# Patient Record
Sex: Male | Born: 1935 | Race: White | Hispanic: No | Smoking: Never smoker
Health system: Southern US, Community
[De-identification: ages and names within clinical notes are randomized; demographics above are authoritative.]

## PROBLEM LIST (undated history)

## (undated) DIAGNOSIS — R32 Unspecified urinary incontinence: Secondary | ICD-10-CM

## (undated) DIAGNOSIS — E559 Vitamin D deficiency, unspecified: Secondary | ICD-10-CM

## (undated) DIAGNOSIS — I739 Peripheral vascular disease, unspecified: Secondary | ICD-10-CM

## (undated) DIAGNOSIS — F0390 Unspecified dementia without behavioral disturbance: Secondary | ICD-10-CM

## (undated) DIAGNOSIS — R2681 Unsteadiness on feet: Secondary | ICD-10-CM

---

## 2017-06-13 ENCOUNTER — Emergency Department (HOSPITAL_COMMUNITY)
Admission: EM | Admit: 2017-06-13 | Discharge: 2017-06-13 | Disposition: A | Discharge status: Home / Self Care | Payer: Medicare Other | Attending: Emergency Medicine | Admitting: Emergency Medicine

## 2017-06-13 ENCOUNTER — Other Ambulatory Visit: Payer: Self-pay

## 2017-06-13 ENCOUNTER — Encounter (HOSPITAL_COMMUNITY): Payer: Self-pay

## 2017-06-13 ENCOUNTER — Emergency Department (HOSPITAL_COMMUNITY): Payer: Medicare Other

## 2017-06-13 DIAGNOSIS — W19XXXA Unspecified fall, initial encounter: Secondary | ICD-10-CM | POA: Insufficient documentation

## 2017-06-13 DIAGNOSIS — Y92129 Unspecified place in nursing home as the place of occurrence of the external cause: Secondary | ICD-10-CM | POA: Insufficient documentation

## 2017-06-13 DIAGNOSIS — Y999 Unspecified external cause status: Secondary | ICD-10-CM | POA: Insufficient documentation

## 2017-06-13 DIAGNOSIS — S3992XA Unspecified injury of lower back, initial encounter: Secondary | ICD-10-CM | POA: Diagnosis not present

## 2017-06-13 DIAGNOSIS — Y939 Activity, unspecified: Secondary | ICD-10-CM | POA: Insufficient documentation

## 2017-06-13 DIAGNOSIS — F0390 Unspecified dementia without behavioral disturbance: Secondary | ICD-10-CM | POA: Diagnosis not present

## 2017-06-13 HISTORY — DX: Unspecified urinary incontinence: R32

## 2017-06-13 HISTORY — DX: Unspecified dementia, unspecified severity, without behavioral disturbance, psychotic disturbance, mood disturbance, and anxiety: F03.90

## 2017-06-13 HISTORY — DX: Peripheral vascular disease, unspecified: I73.9

## 2017-06-13 HISTORY — DX: Vitamin D deficiency, unspecified: E55.9

## 2017-06-13 HISTORY — DX: Unsteadiness on feet: R26.81

## 2017-06-13 NOTE — ED Triage Notes (Signed)
Pt BIB [facility] for eval of lower back pain x 2 days. EMS reports fall yesterday, pt was not evaluated at that time. Pt currently anticoagulated on eliquis. Pt lives in a dementia unit and is a poor historian

## 2017-06-13 NOTE — Discharge Instructions (Addendum)
There were no signs of serious injury from the fall yesterday.  For pain, take Tylenol every 4 hours.  Try using heat on the sore area 3 or 4 times a day.

## 2017-06-13 NOTE — ED Provider Notes (Signed)
[facility] Provider Note   CSN: [id number] Arrival date & time: 06/13/17  1953     History   Chief Complaint Chief Complaint  Patient presents with  . Fall    HPI Paul Cox is a 84 y.o. male.  Patient presents for evaluation of low back pain, onset after a fall yesterday at his nursing care facility.  He is unable to specify what happened.  There is no report of head injury.  Fall was reported to be yesterday.  There are no other known modifying factors.  HPI  Past Medical History:  Diagnosis Date  . Dementia   . PVD (peripheral vascular disease) (HCC)   . Unsteadiness on feet   . Urinary incontinence   . Vitamin D deficiency     There are no active problems to display for this patient.   History reviewed. No pertinent surgical history.      Home Medications    Prior to Admission medications   Not on File    Family History History reviewed. No pertinent family history.  Social History Social History   Tobacco Use  . Smoking status: Never Smoker  . Smokeless tobacco: Never Used  Substance Use Topics  . Alcohol use: Not Currently  . Drug use: Not Currently     Allergies   Patient has no known allergies.   Review of Systems Review of Systems  Unable to perform ROS: Dementia     Physical Exam Updated Vital Signs BP (!) 122/106 (BP Location: Right Arm)   Pulse 65   Temp 98.8 F (37.1 C) (Oral)   Resp 20   Ht 5\' 7"  (1.702 m)   Wt 92.1 kg (203 lb)   SpO2 96%   BMI 31.79 kg/m   Physical Exam  Constitutional: He is oriented to person, place, and time. He appears well-developed and well-nourished.  HENT:  Head: Normocephalic and atraumatic.  Right Ear: External ear normal.  Left Ear: External ear normal.  Eyes: Pupils are equal, round, and reactive to light. Conjunctivae and EOM are normal.  Neck: Normal range of motion and phonation normal. Neck supple.  Cardiovascular: Normal rate  and regular rhythm.  Pulmonary/Chest: Effort normal and breath sounds normal. He exhibits no bony tenderness.  Abdominal: Soft. There is no tenderness.  Musculoskeletal:  Tender over the lumbar spine without deformity.  No tenderness over the thoracic spine or cervical spine.  Normal range of motion in arms and legs bilaterally.  Neurological: He is alert and oriented to person, place, and time. No cranial nerve deficit or sensory deficit. He exhibits normal muscle tone. Coordination normal.  Skin: Skin is warm, dry and intact.  Psychiatric: He has a normal mood and affect. His behavior is normal. Judgment and thought content normal.  Nursing note and vitals reviewed.    ED Treatments / Results  Labs (all labs ordered are listed, but only abnormal results are displayed) Labs Reviewed - No data to display  EKG None  Radiology Dg Lumbar Spine Complete  Result Date: 06/13/2017 CLINICAL DATA:  Back pain EXAM: LUMBAR SPINE - COMPLETE 4+ VIEW COMPARISON:  None. FINDINGS: Chronic appearing T12 compression fracture. Lower lumbar predominant disc space narrowing and facet hypertrophy. Mild delete the left convex curvature of the lower lumbar spine.Extensive vascular calcification. No anterior or posterior subluxation. IMPRESSION: 1. Chronic appearing compression fracture of T12. 2. Lower lumbar predominant degenerative disc disease and facet arthrosis. Electronically Signed   By: Deatra RobinsonKevin  Herman  M.D.   On: 06/13/2017 22:08   Dg Pelvis 1-2 Views  Result Date: 06/13/2017 CLINICAL DATA:  Pain EXAM: PELVIS - 1-2 VIEW COMPARISON:  None. FINDINGS: Staples in the right lower quadrant. The SI joints are non widened. Pubic symphysis and rami are intact. No acute displaced fracture or dislocation. IMPRESSION: No definite acute osseous abnormality. Electronically Signed   By: Jasmine Pang M.D.   On: 06/13/2017 22:01    Procedures Procedures (including critical care time)  Medications Ordered in  ED Medications - No data to display   Initial Impression / Assessment and Plan / ED Course  I have reviewed the triage vital signs and the nursing notes.  Pertinent labs & imaging results that were available during my care of the patient were reviewed by me and considered in my medical decision making (see chart for details).  Clinical Course as of Jun 14 2214  Tue Jun 13, 2017  2214 No acute fracture  DG Lumbar Spine Complete [EW]  2214 No acute fracture  DG Pelvis 1-2 Views [EW]    Clinical Course User Index [EW] Mancel Bale, MD     Patient Vitals for the past 24 hrs:  BP Temp Temp src Pulse Resp SpO2 Height Weight  06/13/17 1957 - - - - - - 5\' 7"  (1.702 m) 92.1 kg (203 lb)  06/13/17 1956 (!) 122/106 98.8 F (37.1 C) Oral 65 20 96 % - -  06/13/17 1953 - - - - - 95 % - -    10:16 PM Reevaluation with update and discussion. After initial assessment and treatment, an updated evaluation reveals no change in clinical status.  Discharge instructions written. Mancel Bale   Medical decision making -fall, subacute, without serious injury.  No fractures.  Doubt unstable metabolic condition.  Nursing Notes Reviewed/ Care Coordinated Applicable Imaging Reviewed Interpretation of Laboratory Data incorporated into ED treatment  The patient appears reasonably screened and/or stabilized for discharge and I doubt any other medical condition or other [facility] requiring further screening, evaluation, or treatment in the ED at this time prior to discharge.  Plan: Home Medications-continue usual medications and use Tylenol if needed for pain; Home Treatments-rest, heat; return here if the recommended treatment, does not improve the symptoms; Recommended follow up-PCP follow-up as needed.   Final Clinical Impressions(s) / ED Diagnoses   Final diagnoses:  Fall, initial encounter  Injury of back, initial encounter    ED Discharge Orders    None       Mancel Bale, MD 06/13/17  2218

## 2018-03-01 ENCOUNTER — Other Ambulatory Visit: Payer: Self-pay

## 2018-03-01 ENCOUNTER — Emergency Department (HOSPITAL_COMMUNITY): Payer: Medicare Other

## 2018-03-01 ENCOUNTER — Inpatient Hospital Stay (HOSPITAL_COMMUNITY)
Admission: EM | Admit: 2018-03-01 | Discharge: 2018-03-05 | DRG: 392 | Disposition: A | Discharge status: Skilled Nursing Facility | Payer: Medicare Other | Source: Skilled Nursing Facility | Attending: Student in an Organized Health Care Education/Training Program | Admitting: Student in an Organized Health Care Education/Training Program

## 2018-03-01 DIAGNOSIS — L304 Erythema intertrigo: Secondary | ICD-10-CM | POA: Diagnosis present

## 2018-03-01 DIAGNOSIS — R32 Unspecified urinary incontinence: Secondary | ICD-10-CM | POA: Diagnosis present

## 2018-03-01 DIAGNOSIS — N400 Enlarged prostate without lower urinary tract symptoms: Secondary | ICD-10-CM | POA: Diagnosis present

## 2018-03-01 DIAGNOSIS — Z79899 Other long term (current) drug therapy: Secondary | ICD-10-CM

## 2018-03-01 DIAGNOSIS — H9190 Unspecified hearing loss, unspecified ear: Secondary | ICD-10-CM | POA: Diagnosis present

## 2018-03-01 DIAGNOSIS — I739 Peripheral vascular disease, unspecified: Secondary | ICD-10-CM | POA: Diagnosis present

## 2018-03-01 DIAGNOSIS — E559 Vitamin D deficiency, unspecified: Secondary | ICD-10-CM | POA: Diagnosis present

## 2018-03-01 DIAGNOSIS — X58XXXA Exposure to other specified factors, initial encounter: Secondary | ICD-10-CM | POA: Diagnosis not present

## 2018-03-01 DIAGNOSIS — T3695XA Adverse effect of unspecified systemic antibiotic, initial encounter: Secondary | ICD-10-CM | POA: Diagnosis not present

## 2018-03-01 DIAGNOSIS — Z7901 Long term (current) use of anticoagulants: Secondary | ICD-10-CM

## 2018-03-01 DIAGNOSIS — R001 Bradycardia, unspecified: Secondary | ICD-10-CM | POA: Diagnosis present

## 2018-03-01 DIAGNOSIS — Z66 Do not resuscitate: Secondary | ICD-10-CM | POA: Diagnosis present

## 2018-03-01 DIAGNOSIS — Z86718 Personal history of other venous thrombosis and embolism: Secondary | ICD-10-CM

## 2018-03-01 DIAGNOSIS — R197 Diarrhea, unspecified: Secondary | ICD-10-CM

## 2018-03-01 DIAGNOSIS — K5732 Diverticulitis of large intestine without perforation or abscess without bleeding: Principal | ICD-10-CM | POA: Diagnosis present

## 2018-03-01 DIAGNOSIS — E860 Dehydration: Secondary | ICD-10-CM | POA: Diagnosis present

## 2018-03-01 DIAGNOSIS — K521 Toxic gastroenteritis and colitis: Secondary | ICD-10-CM | POA: Diagnosis not present

## 2018-03-01 DIAGNOSIS — K5792 Diverticulitis of intestine, part unspecified, without perforation or abscess without bleeding: Secondary | ICD-10-CM | POA: Diagnosis not present

## 2018-03-01 DIAGNOSIS — R1030 Lower abdominal pain, unspecified: Secondary | ICD-10-CM

## 2018-03-01 DIAGNOSIS — F0390 Unspecified dementia without behavioral disturbance: Secondary | ICD-10-CM | POA: Diagnosis present

## 2018-03-01 LAB — CBC
HCT: 42.1 % (ref 39.0–52.0)
Hemoglobin: 13.4 g/dL (ref 13.0–17.0)
MCH: 29.1 pg (ref 26.0–34.0)
MCHC: 31.8 g/dL (ref 30.0–36.0)
MCV: 91.3 fL (ref 80.0–100.0)
Platelets: 176 10*3/uL (ref 150–400)
RBC: 4.61 MIL/uL (ref 4.22–5.81)
RDW: 13.2 % (ref 11.5–15.5)
WBC: 7.2 10*3/uL (ref 4.0–10.5)
nRBC: 0 % (ref 0.0–0.2)

## 2018-03-01 LAB — COMPREHENSIVE METABOLIC PANEL
ALBUMIN: 3.3 g/dL — AB (ref 3.5–5.0)
ALT: 12 U/L (ref 0–44)
AST: 19 U/L (ref 15–41)
Alkaline Phosphatase: 56 U/L (ref 38–126)
Anion gap: 9 (ref 5–15)
BUN: 17 mg/dL (ref 8–23)
CHLORIDE: 110 mmol/L (ref 98–111)
CO2: 21 mmol/L — ABNORMAL LOW (ref 22–32)
Calcium: 8.7 mg/dL — ABNORMAL LOW (ref 8.9–10.3)
Creatinine, Ser: 1.21 mg/dL (ref 0.61–1.24)
GFR calc Af Amer: >60 mL/min (ref >60)
GFR, EST NON AFRICAN AMERICAN: 52 mL/min — AB (ref >60)
GLUCOSE: 98 mg/dL (ref 70–99)
Potassium: 4.2 mmol/L (ref 3.5–5.1)
Sodium: 140 mmol/L (ref 135–145)
Total Bilirubin: 0.6 mg/dL (ref 0.3–1.2)
Total Protein: 7 g/dL (ref 6.5–8.1)

## 2018-03-01 LAB — CK: Total CK: 59 U/L (ref 49–397)

## 2018-03-01 LAB — I-STAT CG4 LACTIC ACID, ED
LACTIC ACID, VENOUS: 1.1 mmol/L (ref 0.5–1.9)
Lactic Acid, Venous: 2.34 mmol/L (ref 0.5–1.9)

## 2018-03-01 LAB — MAGNESIUM: MAGNESIUM: 2.1 mg/dL (ref 1.7–2.4)

## 2018-03-01 LAB — LIPASE, BLOOD: LIPASE: 31 U/L (ref 11–51)

## 2018-03-01 MED ORDER — FOLIC ACID 1 MG PO TABS
1.0000 mg | ORAL_TABLET | Freq: Every day | ORAL | Status: DC
  Administered 2018-03-02 – 2018-03-05 (×4): 1 mg via ORAL
  Filled 2018-03-01 (×4): qty 1

## 2018-03-01 MED ORDER — SODIUM CHLORIDE 0.9 % IV SOLN
INTRAVENOUS | Status: DC
  Administered 2018-03-02 – 2018-03-03 (×4): via INTRAVENOUS

## 2018-03-01 MED ORDER — SODIUM CHLORIDE 0.9 % IV BOLUS
500.0000 mL | Freq: Once | INTRAVENOUS | Status: AC
  Administered 2018-03-01: 500 mL via INTRAVENOUS

## 2018-03-01 MED ORDER — APIXABAN 5 MG PO TABS
5.0000 mg | ORAL_TABLET | Freq: Two times a day (BID) | ORAL | Status: DC
  Administered 2018-03-02 – 2018-03-05 (×8): 5 mg via ORAL
  Filled 2018-03-01 (×8): qty 1

## 2018-03-01 MED ORDER — QUETIAPINE FUMARATE 50 MG PO TABS
50.0000 mg | ORAL_TABLET | Freq: Every day | ORAL | Status: DC
  Administered 2018-03-02 – 2018-03-04 (×4): 50 mg via ORAL
  Filled 2018-03-01 (×4): qty 1

## 2018-03-01 MED ORDER — IOHEXOL 300 MG/ML  SOLN
100.0000 mL | Freq: Once | INTRAMUSCULAR | Status: AC | PRN
  Administered 2018-03-01: 100 mL via INTRAVENOUS

## 2018-03-01 MED ORDER — SODIUM CHLORIDE 0.9% FLUSH: 3.0000 mL | INTRAVENOUS | Status: DC | PRN

## 2018-03-01 MED ORDER — SODIUM CHLORIDE 0.9 % IV SOLN
2.0000 g | Freq: Once | INTRAVENOUS | Status: AC
  Administered 2018-03-01: 2 g via INTRAVENOUS
  Filled 2018-03-01: qty 20

## 2018-03-01 MED ORDER — NYSTATIN 100000 UNIT/GM EX POWD
Freq: Three times a day (TID) | CUTANEOUS | Status: DC
  Administered 2018-03-02 – 2018-03-05 (×8): via TOPICAL
  Filled 2018-03-01: qty 15

## 2018-03-01 MED ORDER — NYSTATIN 100000 UNIT/GM EX POWD
Freq: Once | CUTANEOUS | Status: AC
  Administered 2018-03-02: 01:00:00 via TOPICAL
  Filled 2018-03-01: qty 15

## 2018-03-01 MED ORDER — SODIUM CHLORIDE 0.9 % IV SOLN: 250.0000 mL | INTRAVENOUS | Status: DC | PRN

## 2018-03-01 MED ORDER — ADULT MULTIVITAMIN W/MINERALS CH
1.0000 | ORAL_TABLET | Freq: Every day | ORAL | Status: DC
  Administered 2018-03-02 – 2018-03-04 (×3): 1 via ORAL
  Filled 2018-03-01 (×4): qty 1

## 2018-03-01 MED ORDER — METRONIDAZOLE IN NACL 5-0.79 MG/ML-% IV SOLN
500.0000 mg | Freq: Three times a day (TID) | INTRAVENOUS | Status: DC
  Administered 2018-03-02: 500 mg via INTRAVENOUS
  Filled 2018-03-01 (×2): qty 100

## 2018-03-01 MED ORDER — SODIUM CHLORIDE 0.9% FLUSH: 3.0000 mL | Freq: Two times a day (BID) | INTRAVENOUS | Status: DC

## 2018-03-01 MED ORDER — VITAMIN B-12 1000 MCG PO TABS
1000.0000 ug | ORAL_TABLET | Freq: Every day | ORAL | Status: DC
  Administered 2018-03-02 – 2018-03-05 (×4): 1000 ug via ORAL
  Filled 2018-03-01 (×4): qty 1

## 2018-03-01 MED ORDER — ACETAMINOPHEN 325 MG PO TABS
650.0000 mg | ORAL_TABLET | Freq: Four times a day (QID) | ORAL | Status: DC | PRN
  Filled 2018-03-01: qty 2

## 2018-03-01 MED ORDER — SODIUM CHLORIDE 0.9 % IV SOLN: 2.0000 g | INTRAVENOUS | Status: DC

## 2018-03-01 MED ORDER — QUETIAPINE FUMARATE 25 MG PO TABS
25.0000 mg | ORAL_TABLET | ORAL | Status: DC
  Administered 2018-03-02 – 2018-03-05 (×7): 25 mg via ORAL
  Filled 2018-03-01 (×7): qty 1

## 2018-03-01 MED ORDER — SENNOSIDES-DOCUSATE SODIUM 8.6-50 MG PO TABS: 1.0000 | ORAL_TABLET | Freq: Every evening | ORAL | Status: DC | PRN

## 2018-03-01 MED ORDER — METRONIDAZOLE IN NACL 5-0.79 MG/ML-% IV SOLN
500.0000 mg | Freq: Once | INTRAVENOUS | Status: AC
  Administered 2018-03-01: 500 mg via INTRAVENOUS
  Filled 2018-03-01: qty 100

## 2018-03-01 MED ORDER — ACETAMINOPHEN 650 MG RE SUPP: 650.0000 mg | Freq: Four times a day (QID) | RECTAL | Status: DC | PRN

## 2018-03-01 MED ORDER — DONEPEZIL HCL 10 MG PO TABS
10.0000 mg | ORAL_TABLET | Freq: Every day | ORAL | Status: DC
  Administered 2018-03-02 – 2018-03-04 (×4): 10 mg via ORAL
  Filled 2018-03-01 (×4): qty 1

## 2018-03-01 MED ORDER — VITAMIN D 25 MCG (1000 UNIT) PO TABS
1000.0000 [IU] | ORAL_TABLET | Freq: Every day | ORAL | Status: DC
  Administered 2018-03-02 – 2018-03-05 (×4): 1000 [IU] via ORAL
  Filled 2018-03-01 (×4): qty 1

## 2018-03-01 MED ORDER — TRAZODONE HCL 50 MG PO TABS
25.0000 mg | ORAL_TABLET | Freq: Two times a day (BID) | ORAL | Status: DC | PRN
  Administered 2018-03-02: 25 mg via ORAL
  Filled 2018-03-01: qty 1

## 2018-03-01 NOTE — ED Notes (Signed)
This RN unable to in and out cath pt due to foreskin issue. Unable to get foreskin to retract and pt complains of pain when penis or scrotum/groin touched when cleaning pt up. Pt had soiled brief on and feces noted to pt's hands.

## 2018-03-01 NOTE — ED Triage Notes (Signed)
[facility] by EMS [facility] [place] [facility] for decreased appetite and ABD pain 2 days ago. Pt has dementia and is a&ox1. Per pt, he doesn't have any ABD pain at this time.

## 2018-03-01 NOTE — H&P (Addendum)
Date: 03/02/2018               Patient Name:  Paul Cox MRN: [id number]  DOB: 16-Feb-1925 Age / Sex: 84 y.o., male   PCP: Patient, No Pcp Per         Medical Service: Internal Medicine Teaching Service         Attending Physician: Dr. Oswaldo Done, Marquita Palms, *    First Contact: Dr. Gwyneth Revels Pager: [phone]  Second Contact: Dr. Frances Furbish Pager: [phone]       After Hours (After 5p/  First Contact Pager: [phone]  weekends / holidays): Second Contact Pager: [phone]   Chief Complaint: Abdominal pain  History of Present Illness: 84 year old male with past medical history of dementia, PVD, vitamin D deficiency, urinary incontinency, and DVT presented with abdominal pain. When saw the patient for admission, he confirms that he had some pain on his abdomen and felt tired today. How ever, due to dementia we were unable to obtain detailed history from the patient. As nursing facility staff reported to EMS, Paul Cox complained of suprapubic pain for past 2 days and had decreased oral intake since 2 weeks ago. Per EMS note, no nause or vomiting, diarrhea and no fever or chills reported by nursing staff in facility. (-->We contacted nursing facility and son after seeing the patient and they confirmed the history)  ED course: Patient has been afebrile and stable, with no leukocytosis (or leukopenia.) on arrival. CT abdomen, was concerning for early acute sigmoid diverticulitis. Patient started with IV ceftriaxone and Flagyl and was given IV fluid at ED. Internal medicine teaching service was then consulted for admission.   Meds:  Current Meds  Medication Sig  . acetaminophen (TYLENOL) 500 MG tablet Take 500 mg by mouth every 8 (eight) hours as needed (for pain).  Marland Kitchen apixaban (ELIQUIS) 5 MG TABS tablet Take 5 mg by mouth 2 (two) times daily.  . Cholecalciferol (VITAMIN D3) 25 MCG (1000 UT) CAPS Take 1,000 Units by mouth daily.  Marland Kitchen donepezil (ARICEPT) 10 MG tablet Take 10 mg by  mouth at bedtime.  . folic acid (FOLVITE) 1 MG tablet Take 1 mg by mouth daily.  Marland Kitchen loperamide (IMODIUM A-D) 2 MG tablet Take 2 mg by mouth every 6 (six) hours as needed for diarrhea or loose stools.  . Multiple Vitamins-Minerals (ONE-A-DAY PROACTIVE 65+) TABS Take 1 tablet by mouth daily.  . QUEtiapine (SEROQUEL) 25 MG tablet Take 25 mg by mouth 2 (two) times daily.  . QUEtiapine (SEROQUEL) 50 MG tablet Take 50 mg by mouth at bedtime.  . traZODone (DESYREL) 25 mg TABS tablet Take 12.5 mg by mouth every 12 (twelve) hours as needed (for agitation).  . vitamin B-12 (CYANOCOBALAMIN) 1000 MCG tablet Take 1,000 mcg by mouth daily.  Marland Kitchen zolpidem (AMBIEN) 5 MG tablet Take 2.5 mg by mouth at bedtime.     Allergies: Allergies as of 03/01/2018  . (No Known Allergies)   Past Medical History:  Diagnosis Date  . Dementia   . PVD (peripheral vascular disease) (HCC)   . Unsteadiness on feet   . Urinary incontinence   . Vitamin D deficiency     Family History:  No family Hx on chart. No Hx available due to dementia  Social History:   Never smoked No alcohol use No drug use  Review of Systems: A complete ROS was negative except as per HPI.  Physical Exam: Blood pressure 138/85, pulse 66, temperature 98.6 F (37 C),  temperature source Rectal, resp. rate (!) 25, SpO2 95 %.  Physical Exam Constitutional:      General: He is not in acute distress. Mucosa is moist HENT:     Head: Normocephalic and atraumatic.  Eyes:     Extraocular Movements: Extraocular movements intact.  Cardiovascular:     Rate and Rhythm: Bradycardia present. Iregular rhythm vs extra beat.     Heart sounds: Normal heart sounds.   Pulmonary:     Effort: Pulmonary effort is normal.     Breath sounds: course crackle present mostly at right base (patient lying on right side).   Abdominal:     General: Bowel sounds are normal.     Palpations: Abdomen is soft    Tenderness: There is no abdominal tenderness.    Genitourinary:   Neurological:     Mental Status: He is alert. Oriented to person but not to place and time.   Labs reviewed:  CBC nl with WBC at 7.2 Lactic acid initialy elevated at 2.34-->improved :1.10 CMP unremarkable except decreased GFR, Cr:1.2 (unclear baseline) CK and Mg normal  EKG: personally reviewed my interpretation is bradycardia, poor R progression, possible left atrial enlargement (Tele: P wave present. PAC, pauses with longest noted at 2.5 seconds.)  CXR: personally reviewed my interpretation is bibasilar atelectasis, mild interstitial edema  Assessment & Plan by Problem: Active Problems:   Diverticulitis   Intertrigo  84 year old male with PMHx of dementia, PVD, vitamin D deficiency, urinary incontinency, presented from nursing facility due to 2 days history of suprapubic abdominal pain and 2 weeks of poor PO intake.  Diverticulitis:  Abdominal pain and poor PO intake, with no fever, no nausea or vomiting, no diarrhea. CT scan showed inflammatory changes about diverticula in the sigmoid colon. No abscess, free fluid, or free air is associated. CBC normal. With elevated lactic acid on arrival.  Benign abdominal exam, lactic acid improved after hydration. Mamie Laurel(Likeley elavated due to poor PO intake.) We will continue IV ceftriaxone and Flagyl for diverticulitis. (Uncomplicated diverticulitis. How ever, considering age>70 and poor PO intake.., inpatient treatment for IV Ab and IV fluid indicated. Possible AKI? Cr:1.2, baseline unclear. Can be 2/2 dehydration and poor PO intake. Will give IV fluid and checking BMP in the morning. UTI?  Unable to insert catheter and no urine sample collected.   -N.p.o., may be able to switch to clear diet tomorrow -NS 17325ml/h -Continue IV ceftraixon 2 g QD -Continue IV Flagyl 500 mg q 8 h -Follow up obtaing urine sample for U/A and U/C -BMP in AM -CBC in AM -Tylenol 650 mg p.o. every 6 hours PRN for pain/fever -Cardiac  monitoring -Senna-decusate 1 tab at bed time PRN for constipation  Intertrigo: Some erythematous area on perineal area likely fungal.  -Continue applying Nistatin topical powder every 8 hours groin  Bradycardia at 50s and with pause on Tele: (Longest pause noted at 2.5 sec). No evidence of Afib or AV block on tele. Asymptomatic. Will check TSH and monitor patient. -TSH -Cardiac monitoring   Irregular prominent prostate gland reported on CT scan: Unclear if it is a new problem. Has Hx of urinary incontinency. Bladder scan with 100 cc residual, no retension. U/A catheterization was unsuccessful and no sample available at this time. Recommend ultrasound and follow-up accordingly.  Chronic problems:  Hx of DVT (Per record from facility): On Eliquis 5 mg twice daily at home since 05/25/2017 (unclear end date).   -Continue Eliquis 5 mg twice daily  Dementia: Oriented x1-2 on  baseline.  Not oriented to place and time on admission, cooperete well with exam.  -Continue home dose of donepezil (10 mg at bedtime) -Continue home dose of Seroquel 25 mg  At AM and 75 mg at PM -Trazodone 25 mg twice daily PRN for agitation  Vit D def: -Continue home meds: vitamin D3 1000 units p.o. daily  -Will continue other home meds: Vitamin B12 1000 MCG p.o. daily and folic acid 1 mg daily  Diet: NPO IV fluid: NS 125 ml/h VTE ppx: Eliquis  Code status: DNR (Dr. Alinda MoneyMelvin talked to the son, Dianna Limbougene A. Pankowski who is patient's HCPOA, and he confirmed DNR status.  Dispo: Admit patient to Observation with expected length of stay less than 2 midnights.  SignedChevis Pretty: Nikhita Mentzel, MD 03/02/2018, 12:09 AM  Pager: [phone]

## 2018-03-01 NOTE — ED Notes (Signed)
Pt. Has no urine present in urine drainage bag

## 2018-03-01 NOTE — ED Provider Notes (Signed)
[facility] EM[facility]GENCY [facility] Provider Note   CSN: [id number] Arrival date & time: 03/01/18  1539     History   Chief Complaint Chief Complaint  Patient presents with  . Abdominal Pain    HPI Barkley Brunsugene J Cox is a 84 y.o. male.  The history is provided by medical records and the patient. No language interpreter was used.  Abdominal Pain   This is a new problem. The current episode started 2 days ago. The problem occurs rarely. The problem has been resolved. The pain is associated with an unknown factor. The pain is located in the generalized abdominal region and perineum. Pain scale: unkn own. Associated symptoms include anorexia. Pertinent negatives include fever, diarrhea, melena, nausea, vomiting, constipation, dysuria and frequency. Nothing aggravates the symptoms.    Past Medical History:  Diagnosis Date  . Dementia   . PVD (peripheral vascular disease) (HCC)   . Unsteadiness on feet   . Urinary incontinence   . Vitamin D deficiency     There are no active problems to display for this patient.   No past surgical history on file.      Home Medications    Prior to Admission medications   Not on File    Family History No family history on file.  Social History Social History   Tobacco Use  . Smoking status: Never Smoker  . Smokeless tobacco: Never Used  Substance Use Topics  . Alcohol use: Not Currently  . Drug use: Not Currently     Allergies   Patient has no known allergies.   Review of Systems Review of Systems  Unable to perform ROS: Dementia  Constitutional: Positive for appetite change and fatigue. Negative for chills, diaphoresis and fever.  HENT: Negative for congestion.   Respiratory: Negative for cough, chest tightness, shortness of breath and wheezing.   Cardiovascular: Negative for chest pain.  Gastrointestinal: Positive for abdominal pain (resolved) and anorexia. Negative for constipation, diarrhea,  melena, nausea and vomiting.  Genitourinary: Positive for genital sores and penile pain. Negative for dysuria, flank pain and frequency.  Musculoskeletal: Negative for back pain.  Neurological: Negative for seizures and numbness.  Psychiatric/Behavioral: Negative for agitation.     Physical Exam Updated Vital Signs BP 128/71 (BP Location: Right Arm)   Pulse 63   Temp 98.2 F (36.8 C) (Oral)   Resp 18   SpO2 98%   Physical Exam Vitals signs and nursing note reviewed. Exam conducted with a chaperone present.  Constitutional:      General: He is not in acute distress.    Appearance: He is not ill-appearing, toxic-appearing or diaphoretic.  HENT:     Head: Normocephalic and atraumatic.     Mouth/Throat:     Pharynx: No oropharyngeal exudate.  Eyes:     Extraocular Movements: Extraocular movements intact.  Cardiovascular:     Rate and Rhythm: Bradycardia present.     Heart sounds: No murmur.  Pulmonary:     Effort: Pulmonary effort is normal. No respiratory distress.     Breath sounds: No stridor. No wheezing or rhonchi.  Abdominal:     General: Abdomen is flat. Bowel sounds are normal. There is no distension.     Tenderness: There is abdominal tenderness in the suprapubic area. There is no right CVA tenderness, left CVA tenderness or guarding.  Genitourinary:    Pubic Area: Rash present.     Scrotum/Testes:        Right: Tenderness not present.  Left: Tenderness not present.       Comments: Chaperone was utilized for exam.  Patient has tenderness, erythema, and warmth in the base of scrotum and in the perineum and lower groin. Skin:    General: Skin is warm.     Capillary Refill: Capillary refill takes less than 2 seconds.     Findings: Erythema and rash present.     Comments: Patient has erythematous rash on his groin and on his scrotum.  Actual testicles are not significantly tender.  Abdomen otherwise nontender.  Perineum tender.  Back nontender.  Neurological:       Mental Status: He is disoriented.      ED Treatments / Results  Labs (all labs ordered are listed, but only abnormal results are displayed) Labs Reviewed  COMPREHENSIVE METABOLIC PANEL - Abnormal; Notable for the following components:      Result Value   CO2 21 (*)    Calcium 8.7 (*)    Albumin 3.3 (*)    GFR calc non Af Amer 52 (*)    All other components within normal limits  I-STAT CG4 LACTIC ACID, ED - Abnormal; Notable for the following components:   Lactic Acid, Venous 2.34 (*)    All other components within normal limits  URINE CULTURE  LIPASE, BLOOD  CBC  MAGNESIUM  CK  URINALYSIS, ROUTINE W REFLEX MICROSCOPIC  BASIC METABOLIC PANEL  I-STAT CG4 LACTIC ACID, ED    EKG EKG Interpretation  Date/Time:  Thursday March 01 2018 16:33:49 EST Ventricular Rate:  56 PR Interval:    QRS Duration: 82 QT Interval:  459 QTC Calculation: 443 R Axis:   -10 Text Interpretation:  Sinus rhythm Probable anterolateral infarct, age indeterm No prior ECG for comparison.  No STEMI Confirmed by Theda Belfastegeler, Chris ([id number]) on 03/01/2018 4:53:47 PM   Radiology Dg Chest 2 View  Result Date: 03/01/2018 CLINICAL DATA:  Fatigue and decreased appetite. Abdominal pain and groin erythema. EXAM: CHEST - 2 VIEW COMPARISON:  None FINDINGS: Low lung volumes. Borderline cardiomegaly with moderate aortic atherosclerosis. Mild interstitial edema is noted with probable trace posterior pleural effusions. Atelectasis is seen at each lung base. IMPRESSION: 1. Borderline cardiomegaly with moderate aortic atherosclerosis. 2. Mild interstitial edema with trace posterior pleural effusions suspected. 3. Bibasilar atelectasis. Electronically Signed   By: Tollie Ethavid  Kwon M.D.   On: 03/01/2018 18:10   Ct Abdomen Pelvis W Contrast  Result Date: 03/01/2018 CLINICAL DATA:  Decreased appetite.  Abdominal pain for 2 days. EXAM: CT ABDOMEN AND PELVIS WITH CONTRAST TECHNIQUE: Multidetector CT imaging of the abdomen and  pelvis was performed using the standard protocol following bolus administration of intravenous contrast. CONTRAST:  100mL OMNIPAQUE IOHEXOL 300 MG/ML  SOLN COMPARISON:  None. FINDINGS: Lower chest: Dependent atelectasis is present bilaterally. The heart is enlarged. Atherosclerotic changes are noted in the descending thoracic aorta and coronary arteries. No significant pleural or pericardial effusion is present. Hepatobiliary: There is diffuse fatty infiltration liver. No discrete lesions are present. The gallbladder is mildly distended. No stone or inflammatory changes are present. The common bile duct is within normal limits for age. Pancreas: Pancreas is atrophic. No discrete lesions are present. Spleen: Normal in size without focal abnormality. Adrenals/Urinary Tract: The adrenal glands and kidneys are normal bilaterally. There is bilateral thinning of the renal parenchyma. No mass lesion or stone is present. There is no hydronephrosis. Ureters and urinary bladder are normal. Stomach/Bowel: A small hiatal hernia is present. The stomach and duodenum are otherwise within  normal limits. Small bowel is unremarkable. Terminal ileum is within normal limits. The appendix is visualized and normal. The ascending and transverse colon are within normal limits. Diverticular changes are present throughout the descending colon. Diverticular changes extend through the sigmoid colon. There is mild phlegm a tori changes proximally suggesting early colonic diverticulitis. No abscess is present. There is no free air. Rectum is unremarkable Vascular/Lymphatic: Extensive atherosclerotic changes are present. There is no aneurysm. No significant retroperitoneal adenopathy is present. Reproductive: Prostate gland is heterogeneous with some nodularity into the bladder base. A right seminal vesicle cyst measures 2.7 cm. Alternatively, this could represent a bladder diverticulum. It appears to be associated with the right seminal vesicle.  Other: Fat herniates into the right inguinal canal. There is no associated bowel. Previous right lower quadrant hernia repair is noted. No residual or recurrent hernia is evident. No free fluid is present. Musculoskeletal: Rightward curvature of the thoracolumbar spine is centered at L2-3. Asymmetric endplate degenerative changes are noted on the left. Asymmetric right-sided changes are present at L4-5 and L5-S1. Vertebral body heights are otherwise maintained remote fractures are present at T11, T12, and L1. No acute fractures are present. IMPRESSION: 1. Inflammatory changes about diverticula in the sigmoid colon concerning for early acute sigmoid diverticulitis. No abscess, free fluid, or free air is associated. 2. Irregular prominent prostate gland. Please correlate with physical exam and laboratory testing as appropriate. No definite tumor is present. 3.  Aortic Atherosclerosis (ICD10-I70.0). 4. Hepatic steatosis. 5. Cardiomegaly without failure. 6. Coronary artery disease. 7. Small hiatal hernia. 8. Scoliosis and degenerative changes in the lower lumbar spine. Electronically Signed   By: Marin Roberts M.D.   On: 03/01/2018 17:51    Procedures Procedures (including critical care time)  CRITICAL CARE Performed by: Canary Brim Tegeler Total critical care time: 35 minutes Critical care time was exclusive of separately billable procedures and treating other patients. Critical care was necessary to treat or prevent imminent or life-threatening deterioration. Critical care was time spent personally by me on the following activities: development of treatment plan with patient and/or surrogate as well as nursing, discussions with consultants, evaluation of patient's response to treatment, examination of patient, obtaining history from patient or surrogate, ordering and performing treatments and interventions, ordering and review of laboratory studies, ordering and review of radiographic studies,  pulse oximetry and re-evaluation of patient's condition.   Medications Ordered in ED Medications  nystatin (MYCOSTATIN/NYSTOP) topical powder (has no administration in time range)  cefTRIAXone (ROCEPHIN) 2 Paul in sodium chloride 0.9 % 100 mL IVPB (has no administration in time range)  metroNIDAZOLE (FLAGYL) IVPB 500 mg (has no administration in time range)  donepezil (ARICEPT) tablet 10 mg (has no administration in time range)  ONE-A-DAY PROACTIVE 65+ TABS 1 tablet (has no administration in time range)  vitamin B-12 (CYANOCOBALAMIN) tablet 1,000 mcg (has no administration in time range)  Vitamin D3 CAPS 1,000 Units (has no administration in time range)  apixaban (ELIQUIS) tablet 5 mg (has no administration in time range)  QUEtiapine (SEROQUEL) tablet 25 mg (has no administration in time range)  traZODone (DESYREL) tablet 25 mg (has no administration in time range)  QUEtiapine (SEROQUEL) tablet 50 mg (has no administration in time range)  folic acid (FOLVITE) tablet 1 mg (has no administration in time range)  nystatin (MYCOSTATIN/NYSTOP) topical powder (has no administration in time range)  sodium chloride flush (NS) 0.9 % injection 3 mL (has no administration in time range)  sodium chloride flush (NS) 0.9 %  injection 3 mL (has no administration in time range)  sodium chloride flush (NS) 0.9 % injection 3 mL (has no administration in time range)  0.9 %  sodium chloride infusion (has no administration in time range)  acetaminophen (TYLENOL) tablet 650 mg (has no administration in time range)    Or  acetaminophen (TYLENOL) suppository 650 mg (has no administration in time range)  senna-docusate (Senokot-S) tablet 1 tablet (has no administration in time range)  iohexol (OMNIPAQUE) 300 MG/ML solution 100 mL (100 mLs Intravenous Contrast Given 03/01/18 1728)  sodium chloride 0.9 % bolus 500 mL (0 mLs Intravenous Stopped 03/01/18 2026)  cefTRIAXone (ROCEPHIN) 2 Paul in sodium chloride 0.9 % 100 mL  IVPB (0 Paul Intravenous Stopped 03/01/18 2000)    And  metroNIDAZOLE (FLAGYL) IVPB 500 mg (0 mg Intravenous Stopped 03/01/18 2140)     Initial Impression / Assessment and Plan / ED Course  I have reviewed the triage vital signs and the nursing notes.  Pertinent labs & imaging results that were available during my care of the patient were reviewed by me and considered in my medical decision making (see chart for details).     Paul Cox is a 84 y.o. male with a past medical history significant for PVD, dementia, and chronic urinary incontinence who presents from his nursing facility for intermittent abdominal discomfort, fatigue, and decreased oral intake.  Patient is only oriented to person and is unable to provide significant history.  EMS reports that for the last 2 days patient has had intermittent abdominal pain at this facility.  He is also not been eating or drinking.  He is reported feeling tired.  They report no evidence of fevers, chills, nausea, vomiting, chest pain, or shortness of breath.  Patient was unable to describe any discomfort he was having.  Level 5 caveat for dementia.  On exam, patient has erythema and tenderness on his scrotum and groin and perineal area.  Patient has some tenderness in the suprapubic area of the abdomen.  Patient is uncircumcised.  Patient had difficulty with urination.  Nursing reports that patient had stool all over his body on arrival.  Patient's abdomen was nontender on my exam otherwise.  Lungs were clear and chest was nontender.  Patient is oriented to person.  Patient moving all extremities.  Patient otherwise resting comfortably.  Clinically I am concerned that the patient may have a skin infection in his groin and scrotum.  Given the amount of tenderness present, will obtain CT imaging after lab work to rule out abscess or deep abdominal infection such as Fournier's gangrene.  Will obtain catheterized urine to get a clean sample.  Patient  will also have other laboratory testing given his decreased oral intake.  Anticipate reassessment after work-up.  10:03 PM Nursing reported they tried in and out catheterization were unsuccessful due to patient's foreskin and the irritation of the skin likely due to fungal infection.  Patient had a condom catheter placed.  After several hours, bladder scan only revealed 120 cc of fluid, patient still unable to provide urine sample.  Lactic acid improved while in the emergency department after some fluids.  CT scan revealed evidence of diverticulitis.  Patient still had some mild abdominal tenderness in his lower abdomen.  Patient was intermittently bradycardic into the 50s and 40s.  Patient is still not been able to urinate for [place] to rule out urinary tract infection as well.  Given the patient's age, inability to urinate, initially elevated lactic  acid, the diverticulitis with abdominal pain, and bradycardia at times, patient was felt appropriate for admission and IV antibiotics and rehydration.  Patient will be admitted for further management.   Final Clinical Impressions(s) / ED Diagnoses   Final diagnoses:  Lower abdominal pain  Diverticulitis     Clinical Impression: 1. Lower abdominal pain   2. Diverticulitis     Disposition: Admit  This note was prepared with assistance of Dragon voice recognition software. Occasional wrong-word or sound-a-like substitutions may have occurred due to the inherent limitations of voice recognition software.     Tegeler, Canary Brim, MD 03/01/18 [phone]

## 2018-03-02 DIAGNOSIS — E559 Vitamin D deficiency, unspecified: Secondary | ICD-10-CM

## 2018-03-02 DIAGNOSIS — K5792 Diverticulitis of intestine, part unspecified, without perforation or abscess without bleeding: Secondary | ICD-10-CM | POA: Diagnosis present

## 2018-03-02 DIAGNOSIS — N39498 Other specified urinary incontinence: Secondary | ICD-10-CM

## 2018-03-02 DIAGNOSIS — R32 Unspecified urinary incontinence: Secondary | ICD-10-CM | POA: Diagnosis present

## 2018-03-02 DIAGNOSIS — Z7901 Long term (current) use of anticoagulants: Secondary | ICD-10-CM

## 2018-03-02 DIAGNOSIS — F0390 Unspecified dementia without behavioral disturbance: Secondary | ICD-10-CM | POA: Diagnosis present

## 2018-03-02 DIAGNOSIS — R001 Bradycardia, unspecified: Secondary | ICD-10-CM | POA: Diagnosis present

## 2018-03-02 DIAGNOSIS — Z66 Do not resuscitate: Secondary | ICD-10-CM | POA: Diagnosis present

## 2018-03-02 DIAGNOSIS — E860 Dehydration: Secondary | ICD-10-CM | POA: Diagnosis present

## 2018-03-02 DIAGNOSIS — K5732 Diverticulitis of large intestine without perforation or abscess without bleeding: Principal | ICD-10-CM

## 2018-03-02 DIAGNOSIS — L304 Erythema intertrigo: Secondary | ICD-10-CM | POA: Diagnosis present

## 2018-03-02 DIAGNOSIS — Z79899 Other long term (current) drug therapy: Secondary | ICD-10-CM | POA: Diagnosis not present

## 2018-03-02 DIAGNOSIS — Z86718 Personal history of other venous thrombosis and embolism: Secondary | ICD-10-CM | POA: Diagnosis not present

## 2018-03-02 DIAGNOSIS — N401 Enlarged prostate with lower urinary tract symptoms: Secondary | ICD-10-CM

## 2018-03-02 DIAGNOSIS — I739 Peripheral vascular disease, unspecified: Secondary | ICD-10-CM

## 2018-03-02 DIAGNOSIS — X58XXXA Exposure to other specified factors, initial encounter: Secondary | ICD-10-CM | POA: Diagnosis not present

## 2018-03-02 DIAGNOSIS — K5790 Diverticulosis of intestine, part unspecified, without perforation or abscess without bleeding: Secondary | ICD-10-CM | POA: Diagnosis not present

## 2018-03-02 DIAGNOSIS — H9190 Unspecified hearing loss, unspecified ear: Secondary | ICD-10-CM | POA: Diagnosis present

## 2018-03-02 DIAGNOSIS — T3695XA Adverse effect of unspecified systemic antibiotic, initial encounter: Secondary | ICD-10-CM | POA: Diagnosis not present

## 2018-03-02 DIAGNOSIS — K521 Toxic gastroenteritis and colitis: Secondary | ICD-10-CM | POA: Diagnosis not present

## 2018-03-02 DIAGNOSIS — N400 Enlarged prostate without lower urinary tract symptoms: Secondary | ICD-10-CM | POA: Diagnosis present

## 2018-03-02 LAB — BASIC METABOLIC PANEL
ANION GAP: 9 (ref 5–15)
BUN: 13 mg/dL (ref 8–23)
CO2: 23 mmol/L (ref 22–32)
Calcium: 7.9 mg/dL — ABNORMAL LOW (ref 8.9–10.3)
Chloride: 110 mmol/L (ref 98–111)
Creatinine, Ser: 1.14 mg/dL (ref 0.61–1.24)
GFR calc Af Amer: >60 mL/min (ref >60)
GFR calc non Af Amer: 56 mL/min — ABNORMAL LOW (ref >60)
Glucose, Bld: 105 mg/dL — ABNORMAL HIGH (ref 70–99)
Potassium: 3.5 mmol/L (ref 3.5–5.1)
Sodium: 142 mmol/L (ref 135–145)

## 2018-03-02 LAB — CBC
HEMATOCRIT: 35 % — AB (ref 39.0–52.0)
Hemoglobin: 11.6 g/dL — ABNORMAL LOW (ref 13.0–17.0)
MCH: 30.1 pg (ref 26.0–34.0)
MCHC: 33.1 g/dL (ref 30.0–36.0)
MCV: 90.9 fL (ref 80.0–100.0)
Platelets: 176 10*3/uL (ref 150–400)
RBC: 3.85 MIL/uL — ABNORMAL LOW (ref 4.22–5.81)
RDW: 13.1 % (ref 11.5–15.5)
WBC: 6.4 10*3/uL (ref 4.0–10.5)
nRBC: 0 % (ref 0.0–0.2)

## 2018-03-02 LAB — URINALYSIS, ROUTINE W REFLEX MICROSCOPIC
BILIRUBIN URINE: NEGATIVE
Glucose, UA: NEGATIVE mg/dL
Hgb urine dipstick: NEGATIVE
Ketones, ur: NEGATIVE mg/dL
Leukocytes, UA: NEGATIVE
NITRITE: NEGATIVE
PH: 6 (ref 5.0–8.0)
Protein, ur: NEGATIVE mg/dL

## 2018-03-02 LAB — TSH: TSH: 1.128 u[IU]/mL (ref 0.350–4.500)

## 2018-03-02 MED ORDER — AMOXICILLIN-POT CLAVULANATE ER 1000-62.5 MG PO TB12
2.0000 | ORAL_TABLET | Freq: Two times a day (BID) | ORAL | Status: AC
  Administered 2018-03-02 – 2018-03-04 (×6): 2 via ORAL
  Filled 2018-03-02 (×6): qty 2

## 2018-03-02 NOTE — Evaluation (Signed)
Physical Therapy Evaluation Patient Details Name: Paul Cox MRN: [id number] DOB: 1924-07-13 Today's Date: 03/02/2018   History of Present Illness  Pt is a 84 y.o. male with advanced dementia admitted from ALF on 03/01/18 with worsening functional status and abdominal pain; worked up for acute diverticulitis. PMH includes dementia, PVD, chronic urinary incontinence.    Clinical Impression  Pt presents with an overall decrease in functional mobility secondary to above. Pt only oriented to self, very HOH and poor historian. [facility], uses wheelchair for mobility, modestly interactive, able to feed self. Today, pt required maxA to attempt standing with RW; once pt ready to return to reclining in chair, unable to redirect in attempt to advance mobility. Pt would benefit from continued acute PT services to maximize functional mobility and independence. Recommend SNF-level therapies to maximize functional mobility, or return to ALF if able to provide increased amount of assist.     Follow Up Recommendations SNF;Supervision/Assistance - 24 hour    Equipment Recommendations  None recommended by PT    Recommendations for Other Services       Precautions / Restrictions Precautions Precautions: Fall Precaution Comments: Chair alarm with safety clasp belt Restrictions Weight Bearing Restrictions: No      Mobility  Bed Mobility               General bed mobility comments: Received sitting in recliner with safety alarm belt  Transfers Overall transfer level: Needs assistance Equipment used: Rolling walker (2 wheeled) Transfers: Sit to/from Stand Sit to Stand: Max assist         General transfer comment: Pt required maxA to assist trunk elevation secondary to generalized weakness, unable to achieve fully upright posture. Pt deciding to sit and scoot back, could not be convinced to attempt additional trials; assist for repositioning in  recliner  Ambulation/Gait             General Gait Details: NT  Stairs            Wheelchair Mobility    Modified Rankin (Stroke Patients Only)       Balance Overall balance assessment: Needs assistance Sitting-balance support: Bilateral upper extremity supported Sitting balance-Leahy Scale: Poor Sitting balance - Comments: Reliant on UE support to maintain upright sitting without back support     Standing balance-Leahy Scale: Zero                               Pertinent Vitals/Pain Pain Assessment: Faces Faces Pain Scale: Hurts even more Pain Location: Back Pain Descriptors / Indicators: Grimacing;Guarding;Moaning Pain Intervention(s): Monitored during session;Limited activity within patient's tolerance    Home Living Family/patient expects to be discharged to:: Skilled nursing facility                 Additional Comments: Per chart, [facility]    Prior Function Level of Independence: Needs assistance   Gait / Transfers Assistance Needed: Pt poor historian. Per chart, son reports- at his normal baseline the patient gets around his facility in a wheelchair, he is modestly interactive, feeds himself.             Hand Dominance        Extremity/Trunk Assessment   Upper Extremity Assessment Upper Extremity Assessment: Generalized weakness    Lower Extremity Assessment Lower Extremity Assessment: Generalized weakness    Cervical / Trunk Assessment Cervical / Trunk Assessment: Other exceptions  Cervical / Trunk Exceptions: Generalized weakness  Communication   Communication: HOH  Cognition Arousal/Alertness: Awake/alert Behavior During Therapy: Agitated Overall Cognitive Status: History of cognitive impairments - at baseline Area of Impairment: Orientation;Attention;Memory;Following commands;Safety/judgement;Awareness                 Orientation Level: Disoriented to;Place;Time Current Attention Level:  Sustained Memory: Decreased short-term memory Following Commands: Follows one step commands inconsistently Safety/Judgement: Decreased awareness of safety;Decreased awareness of deficits Awareness: Intellectual   General Comments: Baseline advanced dementia; difficult to truly assess as pt very HOH. [place] in [place], then got stuck on answering [place] for other questions      General Comments      Exercises     Assessment/Plan    PT Assessment Patient needs continued PT services  PT Problem List Decreased strength;Decreased activity tolerance;Decreased balance;Decreased mobility;Decreased cognition;Decreased safety awareness;Decreased knowledge of use of DME       PT Treatment Interventions DME instruction;Functional mobility training;Therapeutic activities;Therapeutic exercise;Balance training;Cognitive remediation;Patient/family education;Wheelchair mobility training    PT Goals (Current goals can be found in the Care Plan section)  Acute Rehab PT Goals PT Goal Formulation: Patient unable to participate in goal setting Time For Goal Achievement: 03/16/18    Frequency Min 2X/week   Barriers to discharge        Co-evaluation               AM-PAC PT "6 Clicks" Mobility  Outcome Measure Help needed turning from your back to your side while in a flat bed without using bedrails?: A Lot Help needed moving from lying on your back to sitting on the side of a flat bed without using bedrails?: A Lot Help needed moving to and from a bed to a chair (including a wheelchair)?: A Lot Help needed standing up from a chair using your arms (e.g., wheelchair or bedside chair)?: A Lot Help needed to walk in hospital room?: Total Help needed climbing 3-5 steps with a railing? : Total 6 Click Score: 10    End of Session   Activity Tolerance: Patient limited by fatigue;Patient limited by pain Patient left: in chair;with call bell/phone within reach;with chair alarm  set Nurse Communication: Mobility status PT Visit Diagnosis: Other abnormalities of gait and mobility (R26.89);Muscle weakness (generalized) (M62.81)    Time: [id number] PT Time Calculation (min) (ACUTE ONLY): 17 min   Charges:   PT Evaluation $PT Eval Moderate Complexity: 1 Mod        Ina HomesJaclyn Asiah Befort, PT, DPT Acute Rehabilitation Services  Pager [phone] Office [phone]  Paul Cox 03/02/2018, 2:43 PM

## 2018-03-02 NOTE — Progress Notes (Signed)
Pt had a 2.48 second pause. Pt is asymptomatic and asleep during time of event. Will continue to monitor.

## 2018-03-02 NOTE — ED Notes (Signed)
Report given to 6 E RN. All questions answered. Pt ready for transport

## 2018-03-02 NOTE — Progress Notes (Signed)
   Subjective:  [name] reports that "he will be alright." He denies abdominal pain.  Patient was just asking for coffee.  He denies any issues at the moment.  No family at bedside. Contacted son who is the POA who reported that the patient has been having abdominal pain for a few days, he has not been having any other complaints or issues. At baseline patient has a history of dementia and is very confused most of the time, he has eben having worsening confusion. He uses a wheelchair to get around due to issues with his gait. He is normally able to feed himself.   Objective:  Vital signs in last 24 hours: Vitals:   03/02/18 0106 03/02/18 0125 03/02/18 0200 03/02/18 0400  BP: 136/84  107/80 (!) 123/59  Pulse: 61     Resp: 17  13 (!) 22  Temp: 98.6 F (37 C)   98.2 F (36.8 C)  TempSrc: Oral   Axillary  SpO2: 96%   98%  Weight:  83 kg      General: Pleasant appearing male, NAD Cardiac: RRR, normal S1, S2, no murmurs, rubs or gallops Pulmonary: Lungs CTA bilaterally, no wheezing, rhonchi or rales  Abdomen: Soft, + BS, no guarding, denied any abdominal pain,  Extremity: No LE edema, no muscle atrophy, no lesions or wounds noted   Assessment/Plan:  Active Problems:   Diverticulitis   Intertrigo  [name] with history of PVD, dementia, chronic urinary incontinence who came in from the nursing facility due to abdominal pain, fatigue and decreased oral intake.  On exam in the ED he was noted to have abdominal pain suprapubic area.  CT abdomen showed early acute sigmoid diverticulitis.   Diverticulitis: Acute sigmoid diverticulitis seen on CT scan.  Denies any abdominal pain today, on exam he tenderness, soft.  He has remained afebrile with no leukocytosis.  Vitals have all been stable.  Lactic acid did improve from 2.3-1.1.  Patient is currently on ceftriaxone and Flagyl.  Patient appears to have a uncomplicated diverticulitis. I am not concerned about an acute abdomen at  this time given his benign exam and hemodynamic stability.  Will transition to oral antibiotics today and continue maintenance fluids for now.  He may be able to be discharged back to facility tomorrow. -Discontinue ceftriaxone and Flagyl -Repeat BMP and CBC in a.Cox. -Switch to Augmentin today -PT/OT eval -Continue maintenance fluids -Continue Tylenol 650 every 6 as needed -Continue Senokot.  Prominent prostate gland seen on CT: Patient does not have any urinary retention on bladder scan.   Bradycardia: Patient did have some bradycardia today.  He had a pause on telemetry, however there was no AV block or dropped beats.  Seemed more consistent with a sinus pause and has a low risk of developing into arrythmias.  -Continue with cardiac monitoring  Intertrigo: Continue nystatin powder.   Dementia: Patient has more patient at baseline he appears comfortable today.  Any issues today. -Continue home donepezil  -Continue home Seroquel -Continue home trazodone as needed for agitation  FEN: NS 125cc/hr, replete lytes prn, Regular diet  VTE ppx: Eliquis Code Status: DNR    Dispo: Anticipated discharge in approximately 1-2 day(s).   Paul Cox, Paul M, MD 03/02/2018, 6:35 AM Pager: [phone]

## 2018-03-02 NOTE — Progress Notes (Signed)
Internal Medicine Teaching Service Attending:   I saw and examined the patient. I reviewed the resident's note and I agree with the resident's findings and plan as documented in the resident's note.  Principal Problem:   Diverticulitis Active Problems:   Intertrigo   Chronic dementia (HCC)  84 year old man with advanced chronic dementia hospital day #1 with worsening functional status, abdominal pain, low oral intake, all likely due to acute diverticulitis.  We were able to talk to his son over the phone today.  At his normal baseline the patient gets around his facility in a wheelchair, he is modestly interactive, feeds himself.  This morning on our exam he was lying in bed, unable to sit up, uninterested in eating breakfast, unable to answer questions.  Functionally he is improving, but still well from his baseline.  Plan to continue with IV fluids and antibiotics today.  PT and OT consults.  Hopefully his functional status will make some improvements in the next 1 to 2 days.  Erlinda Honguncan Vincent, MD FACP

## 2018-03-02 NOTE — Evaluation (Signed)
Occupational Therapy Evaluation Patient Details Name: Paul Cox J Ferraris MRN: [id number] DOB: Mar 16, 1924 Today's Date: 03/02/2018    History of Present Illness Pt is a 84 y.o. male with advanced dementia admitted from ALF on 03/01/18 with worsening functional status and abdominal pain; worked up for acute diverticulitis. PMH includes dementia, PVD, chronic urinary incontinence.   Clinical Impression   Pt is a 84 yo male with dementia and agitated throughout session. Pt from local nursing home and came in for above dx. Pt PTA: per chart, mostly cared by staff and mobility by wheelchair. Pt performing BUE exercise with reduced ROM above shoulder level. Pt performing light grooming with set-upA. Pt remained in chair for session. Pt alert and oriented x2. Pt performing limited eval at this time as pt has dementia and is HOH. Per SN, pt Max +2 for transfer from bed to recliner. Safety alarms on upon exiting. Pt does not require continued OT skilled services when he returns to SNF. Thank you for this referral.    Follow Up Recommendations  SNF(no therapy required)    Equipment Recommendations  None recommended by OT    Recommendations for Other Services       Precautions / Restrictions Precautions Precautions: Fall Precaution Comments: Chair alarm with safety clasp belt Restrictions Weight Bearing Restrictions: No      Mobility Bed Mobility Overal bed mobility: Needs Assistance             General bed mobility comments: Received sitting in recliner with safety alarm belt  Transfers Overall transfer level: Needs assistance Equipment used: Rolling walker (2 wheeled) Transfers: Sit to/from Stand Sit to Stand: Max assist         General transfer comment: Pt with increased agitation, no transfer performed    Balance Overall balance assessment: Needs assistance Sitting-balance support: Bilateral upper extremity supported Sitting balance-Leahy Scale: Poor Sitting balance  - Comments: Reliant on UE support to maintain upright sitting without back support     Standing balance-Leahy Scale: Zero                             ADL either performed or assessed with clinical judgement   ADL Overall ADL's : Needs assistance/impaired Eating/Feeding: Set up   Grooming: Wash/dry hands;Wash/dry face;Set up   Upper Body Bathing: Maximal assistance   Lower Body Bathing: Maximal assistance   Upper Body Dressing : Maximal assistance   Lower Body Dressing: Maximal assistance   Toilet Transfer: Maximal assistance;+2 for physical assistance;BSC           Functional mobility during ADLs: Maximal assistance;+2 for physical assistance;+2 for safety/equipment General ADL Comments: Pt requires assist from staff for ADLs other than light grooming and feeding self after set-upA     Vision Baseline Vision/History: No visual deficits       Perception     Praxis      Pertinent Vitals/Pain Pain Assessment: Faces Faces Pain Scale: Hurts even more Pain Location: feet Pain Descriptors / Indicators: Grimacing;Guarding;Moaning Pain Intervention(s): Repositioned     Hand Dominance Right   Extremity/Trunk Assessment Upper Extremity Assessment Upper Extremity Assessment: Generalized weakness;Overall WFL for tasks assessed(weakness above 90* shoulder flex)   Lower Extremity Assessment Lower Extremity Assessment: Generalized weakness(knees flexed)   Cervical / Trunk Assessment Cervical / Trunk Assessment: Other exceptions Cervical / Trunk Exceptions: Generalized weakness   Communication Communication Communication: HOH   Cognition Arousal/Alertness: Awake/alert Behavior During Therapy: Agitated Overall Cognitive Status: History  of cognitive impairments - at baseline Area of Impairment: Orientation;Attention;Memory;Following commands;Safety/judgement;Awareness                 Orientation Level: Disoriented to;Place;Time Current Attention  Level: Sustained Memory: Decreased short-term memory Following Commands: Follows one step commands inconsistently Safety/Judgement: Decreased awareness of safety;Decreased awareness of deficits Awareness: Intellectual   General Comments: Baseline dementia; thought he was living in [place]   General Comments       Exercises     Shoulder Instructions      Home Living Family/patient expects to be discharged to:: Skilled nursing facility                                 Additional Comments: Per chart[facility]      Prior Functioning/Environment Level of Independence: Needs assistance  Gait / Transfers Assistance Needed: Pt poor historian. Per chart, son reports- at his normal baseline the patient gets around his facility in a wheelchair, he is modestly interactive, feeds himself.   ADL's / Homemaking Assistance Needed: feeds himself; mostly cared for by staff due to dementia            OT Problem List: Decreased strength;Decreased activity tolerance;Pain      OT Treatment/Interventions:      OT Goals(Current goals can be found in the care plan section) Acute Rehab OT Goals Potential to Achieve Goals: Fair  OT Frequency:     Barriers to D/C:            Co-evaluation              AM-PAC OT "6 Clicks" Daily Activity     Outcome Measure Help from another person eating meals?: A Little Help from another person taking care of personal grooming?: A Little Help from another person toileting, which includes using toliet, bedpan, or urinal?: Total Help from another person bathing (including washing, rinsing, drying)?: A Lot Help from another person to put on and taking off regular upper body clothing?: A Lot Help from another person to put on and taking off regular lower body clothing?: Total 6 Click Score: 12   End of Session Nurse Communication: Mobility status  Activity Tolerance: Treatment limited secondary to agitation;Patient  limited by lethargy Patient left: in chair;with call bell/phone within reach;with chair alarm set  OT Visit Diagnosis: Unsteadiness on feet (R26.81);Muscle weakness (generalized) (M62.81)                Time: [id number] OT Time Calculation (min): 16 min Charges:  OT General Charges $OT Visit: 1 Visit OT Evaluation $OT Eval Low Complexity: 1 Low  Cristi LoronAllison (Jelenek) Gl[place]ell [place] OTR/L Acute Rehabilitation Services Pager: [phone] Office: [phone]  Sandrea HughsLLYSON  JELENEK 03/02/2018, 3:20 PM

## 2018-03-02 NOTE — Discharge Instructions (Signed)

## 2018-03-02 NOTE — Progress Notes (Signed)
Patient had a 2.5 second pause. He was asymptomatic, sitting in a chair. Md has documented being aware of pauses. Will closely monitor

## 2018-03-02 NOTE — Clinical Social Work Note (Signed)
Clinical Social Work Assessment  Patient Details  Name: Paul Cox MRN: [id number] Date of Birth: 08/22/1924  Date of referral:  03/02/18               Reason for consult:  Facility Placement, Discharge Planning                Permission sought to share information with:  Facility Sport and exercise psychologist, Family Supports Permission granted to share information::  Yes, Verbal Permission Granted  Name::     [place]a  Agency::  [facility]  Relationship::  son  Contact Information:  [phone]   Housing/Transportation Living arrangements for the past 2 months:  [place] of Information:  Adult Children Patient Interpreter Needed:  None Criminal Activity/Legal Involvement Pertinent to Current Situation/Hospitalization:  No - Comment as needed Significant Relationships:  Adult Children Lives with:  Facility Resident Do you feel safe going back to the place where you live?  Yes Need for family participation in patient care:  Yes (Comment)  Care giving concerns: Patient from [facility] ([place]). PT recommending SNF.   Social Worker assessment / plan: CSW met with patient briefly at bedside. Patient lethargic, hard of hearing, somewhat confused. CSW called patient's son, Whitman, and discussed disposition planning with son. Son would prefer for patient to go back to [facility] rather than SNF.  CSW to follow up with [facility] to determine if patient can return to their care with some additional support or if they will recommend SNF. CSW to follow and support with discharge planning.   Employment status:  Retired Forensic scientist:  [employer] PT Recommendations:  [place] / Referral to community resources:  [place]  Patient/Family's Response to care: Son appreciative of care.  Patient/Family's Understanding of and Emotional Response to Diagnosis, Current Treatment, and Prognosis: Son  with understanding of patient's condition. Prefers return to ALF rather than SNF.  Emotional Assessment Appearance:  Appears stated age Attitude/Demeanor/Rapport:  Lethargic, Other(confused) Affect (typically observed):  Unable to Assess Orientation:  Oriented to Self, Oriented to Place Alcohol / Substance use:  Not Applicable Psych involvement (Current and /or in the community):  No (Comment)  Discharge Needs  Concerns to be addressed:  Discharge Planning Concerns, Care Coordination Readmission within the last 30 days:  No Current discharge risk:  Physical Impairment, Cognitively Impaired Barriers to Discharge:  Continued Medical Work up   Estanislado Emms, LCSW 03/02/2018, 4:33 PM

## 2018-03-03 LAB — COMPREHENSIVE METABOLIC PANEL
ALT: 9 U/L (ref 0–44)
ANION GAP: 6 (ref 5–15)
AST: 15 U/L (ref 15–41)
Albumin: 2.8 g/dL — ABNORMAL LOW (ref 3.5–5.0)
Alkaline Phosphatase: 54 U/L (ref 38–126)
BILIRUBIN TOTAL: 0.8 mg/dL (ref 0.3–1.2)
BUN: 8 mg/dL (ref 8–23)
CO2: 21 mmol/L — ABNORMAL LOW (ref 22–32)
Calcium: 8 mg/dL — ABNORMAL LOW (ref 8.9–10.3)
Chloride: 112 mmol/L — ABNORMAL HIGH (ref 98–111)
Creatinine, Ser: 1 mg/dL (ref 0.61–1.24)
GFR calc Af Amer: >60 mL/min (ref >60)
GFR calc non Af Amer: >60 mL/min (ref >60)
Glucose, Bld: 131 mg/dL — ABNORMAL HIGH (ref 70–99)
Potassium: 3.3 mmol/L — ABNORMAL LOW (ref 3.5–5.1)
Sodium: 139 mmol/L (ref 135–145)
TOTAL PROTEIN: 6 g/dL — AB (ref 6.5–8.1)

## 2018-03-03 LAB — CBC WITH DIFFERENTIAL/PLATELET
Abs Immature Granulocytes: 0.04 10*3/uL (ref 0.00–0.07)
BASOS ABS: 0 10*3/uL (ref 0.0–0.1)
BASOS PCT: 0 %
Eosinophils Absolute: 0.1 10*3/uL (ref 0.0–0.5)
Eosinophils Relative: 1 %
HCT: 37.8 % — ABNORMAL LOW (ref 39.0–52.0)
Hemoglobin: 12.2 g/dL — ABNORMAL LOW (ref 13.0–17.0)
Immature Granulocytes: 1 %
Lymphocytes Relative: 23 %
Lymphs Abs: 1.5 10*3/uL (ref 0.7–4.0)
MCH: 29 pg (ref 26.0–34.0)
MCHC: 32.3 g/dL (ref 30.0–36.0)
MCV: 90 fL (ref 80.0–100.0)
Monocytes Absolute: 0.5 10*3/uL (ref 0.1–1.0)
Monocytes Relative: 7 %
Neutro Abs: 4.4 10*3/uL (ref 1.7–7.7)
Neutrophils Relative %: 68 %
PLATELETS: 190 10*3/uL (ref 150–400)
RBC: 4.2 MIL/uL — ABNORMAL LOW (ref 4.22–5.81)
RDW: 12.9 % (ref 11.5–15.5)
WBC: 6.5 10*3/uL (ref 4.0–10.5)
nRBC: 0 % (ref 0.0–0.2)

## 2018-03-03 LAB — URINE CULTURE: Culture: <10000 — AB

## 2018-03-03 LAB — C DIFFICILE QUICK SCREEN W PCR REFLEX
C Diff antigen: NEGATIVE
C Diff interpretation: NOT DETECTED
C Diff toxin: NEGATIVE

## 2018-03-03 MED ORDER — GERHARDT'S BUTT CREAM
TOPICAL_CREAM | Freq: Four times a day (QID) | CUTANEOUS | Status: DC
  Administered 2018-03-03 – 2018-03-04 (×4): via TOPICAL
  Administered 2018-03-04: 1 via TOPICAL
  Administered 2018-03-05 (×2): via TOPICAL
  Filled 2018-03-03 (×2): qty 1

## 2018-03-03 MED ORDER — LOPERAMIDE HCL 2 MG PO CAPS
2.0000 mg | ORAL_CAPSULE | ORAL | Status: DC | PRN
  Administered 2018-03-04 (×2): 2 mg via ORAL
  Filled 2018-03-03 (×2): qty 1

## 2018-03-03 MED ORDER — POTASSIUM CHLORIDE CRYS ER 20 MEQ PO TBCR
40.0000 meq | EXTENDED_RELEASE_TABLET | Freq: Once | ORAL | Status: AC
  Administered 2018-03-03: 40 meq via ORAL
  Filled 2018-03-03: qty 2

## 2018-03-03 NOTE — Progress Notes (Signed)
Notified Dr. Gillian ShieldsSantos-Sachez about pt having continuous watery diarrhea. New orders given. Cont to monitor. Emelda Brothershristy Shearon Clonch RN

## 2018-03-03 NOTE — Progress Notes (Addendum)
   Subjective: [name] states that he is doing fine this morning.  He continues to state over and over again "I just want to sleep" to all my questions.  There is no family at bedside this morning.  Objective:  Vital signs in last 24 hours: Vitals:   03/02/18 0729 03/02/18 1332 03/02/18 2022 03/03/18 0456  BP: 108/63 (!) 112/57 106/63 110/71  Pulse:  60 (!) 58 (!) 53  Resp: (!) 21 14 (!) 21 19  Temp:  98.1 F (36.7 C) 98.1 F (36.7 C) 98.6 F (37 C)  TempSrc:  Oral Oral Axillary  SpO2:  100% 100% 100%  Weight:    81.9 kg   Physical Exam Vitals signs and nursing note reviewed.  Constitutional:      Appearance: He is well-developed.     Comments: Sleeping in bed in no acute distress.  Abdominal:     Comments: No tenderness to palpation.  He does continue to push my hand away stating that he would like to go to sleep.  Neurological:     Mental Status: He is alert.    Assessment/Plan:  Principal Problem:   Diverticulitis Active Problems:   Intertrigo   Chronic dementia [facility](HCC)  [name] with advanced dementia who presented with worsening functional status, abdominal pain, decreased p.o. intake, and found to have acute diverticulitis.  He has been treated with IV fluids and antibiotics with some improvement in his mental status.  He does seem to be below his baseline functional status per family but I do believe that this will continue to improve with antibiotic treatment.  He was evaluated by PT and OT which both recommended SNF placement.  Per social work note, family would like him to go back to [facility] rather than SNF.  I will follow-up with social work about his families wishes.  Diverticulitis: - Continue Augmentin (day 3/7) - Continue PT/OT - Started on clear liquid diet yesterday and is tolerating well.  Will advance to regular diet today. - Continue Tylenol 650 mg every 6 hours PRN - Continue Senokot  Prominent prostate gland  seen on CT: No signs of urinary retention on bladder scan.  We will continue to monitor.  Asymptomatic bradycardia: Telemetry reviewed and showed sinus bradycardia.  -Continue cardiac monitoring  Dementia: He appears comfortable today. - Continue home donezepil - Continue home Seroquel - Continue home trazodone as needed for agitation  FEN: Normal saline 125 cc/h, full liquid diet, advance as tolerated. VTE prophylaxis: Eliquis CODE STATUS: DNR  Dispo: Anticipated discharge in approximately 0 to 1 days.  Synetta ShadowPrince, Paul Cox M, MD 03/03/2018, 7:08 AM Pager: [phone]

## 2018-03-03 NOTE — Progress Notes (Signed)
CSW received a call from pt's provider stating pt is ready for D/C but family desires pt go to an ALF versus the SNF that the pt came from, despite the PT recommendation for SNF.  Provider asks that the CSW explain the obstacle to ALF from [facility] due to the PT recommendation.  CSW will continue to follow for D/C needs.  Dorothe PeaJonathan F. Javayah Magaw, LCSW, LCAS, CSI Clinical Social Worker Ph: [phone]

## 2018-03-03 NOTE — Progress Notes (Addendum)
CSW spoke to pt's son Paul Cox,Paul Cox at ph: [phone] who [facility] even though pt received a PT recommendation for SNF.  CSW advised pt's son to inform [facility] of pt's recommendation for SNF and counseled pt's son on how this may be an obstacle to D/C'ing to an ALF and asked the pt's son to please update the CSW on Sunday 28th.  CSW will continue to follow for D/C needs.  Paul PeaJonathan F. Chevelle Coulson, LCSW, LCAS, CSI Clinical Social Worker Ph: [phone]     \

## 2018-03-03 NOTE — Progress Notes (Signed)
CSW received a call from the provider stating pt is not not ready for D/C, but asks that the family be spoken to and counseled that pt received a PT recommendation for SNF and thus is not likely to be appropriate for, nor accepted by an ALF due to this pt needing a higher level of care and that any transfer to a ALF would have to take place post-D/C if appropriate.  CSW will continue to follow for D/C needs.  Dorothe PeaJonathan F. Latoya Maulding, LCSW, LCAS, CSI Clinical Social Worker Ph: [phone]

## 2018-03-03 NOTE — Progress Notes (Signed)
Notified attending team C-Diff neg. Stated they will order imodium for extreme diarrhea. Emelda Brothershristy Ryin Schillo RN

## 2018-03-04 DIAGNOSIS — R197 Diarrhea, unspecified: Secondary | ICD-10-CM

## 2018-03-04 DIAGNOSIS — N400 Enlarged prostate without lower urinary tract symptoms: Secondary | ICD-10-CM

## 2018-03-04 DIAGNOSIS — K5790 Diverticulosis of intestine, part unspecified, without perforation or abscess without bleeding: Secondary | ICD-10-CM

## 2018-03-04 LAB — BASIC METABOLIC PANEL
Anion gap: 12 (ref 5–15)
BUN: 6 mg/dL — ABNORMAL LOW (ref 8–23)
CO2: 17 mmol/L — ABNORMAL LOW (ref 22–32)
Calcium: 8.4 mg/dL — ABNORMAL LOW (ref 8.9–10.3)
Chloride: 111 mmol/L (ref 98–111)
Creatinine, Ser: 1.07 mg/dL (ref 0.61–1.24)
GFR calc Af Amer: >60 mL/min (ref >60)
GFR calc non Af Amer: >60 mL/min (ref >60)
Glucose, Bld: 99 mg/dL (ref 70–99)
Potassium: 3.7 mmol/L (ref 3.5–5.1)
Sodium: 140 mmol/L (ref 135–145)

## 2018-03-04 MED ORDER — AMOXICILLIN-POT CLAVULANATE ER 1000-62.5 MG PO TB12: 2.0000 | ORAL_TABLET | Freq: Two times a day (BID) | ORAL | 0 refills | Status: AC

## 2018-03-04 NOTE — Progress Notes (Signed)
CSW received a call from the RN CN on [facility] [place] stating pt is now ready for D/C.    CSW reviewed pt's chart and sees pt has Medicare A&B and as such a 3-day inpatient stay is required and per pt's chart pt has been inpatient for 2 days.  Pt's son spoke to a staff member at ph: [phone] at [facility] on [facility]/28 and the staff member stated it would be best for the pt to go to a SNF first and then return to the [facility] facility at [facility] on [address].  CSW is aware that the staff member who is at this facility at this number on the weekends is a "med tech" however and is not an admissions person in most cases.  CSW will continue to follow for D/C needs.  Dorothe PeaJonathan F. Feliza Diven, LCSW, LCAS, CSI Clinical Social Worker Ph: [phone]

## 2018-03-04 NOTE — Progress Notes (Signed)
Internal Medicine Teaching Service Attending:   I saw and examined the patient. I reviewed the resident's note and I agree with the resident's findings and plan as documented in the resident's note.  Principal Problem:   Diverticulitis Active Problems:   Intertrigo   Chronic dementia [facility](HCC)   Acute diarrhea  Hospital day #3 for this 84 year old man living with dementia admitted with decreased functional status and abdominal pain due to a mild diverticulitis.  He has responded well to antibiotic therapy, functioning back near his baseline according to his son.  He did develop acute antibiotic associated diarrhea yesterday which is improving with Imodium.  Physical therapy says he is requiring maximum assist for out of bed activity.  Our team talked to his son carefully, they would like him to discharge to assisted living facility instead of SNF which we are working to arrange.  Dr. Rogelia BogaButcher will take over as attending physician tomorrow.  Erlinda Honguncan Vincent, MD FACP

## 2018-03-04 NOTE — Progress Notes (Signed)
CSW spoke to pt's son who stated CSW has permission to create FL-2 and refer pt out to SNF facilities in the [place] area.  CSW updated [facility] [place] RN CN.  CSW will continue to follow for D/C needs.  Dorothe PeaJonathan F. Quina Wilbourne, LCSW, LCAS, CSI Clinical Social Worker Ph: [phone]

## 2018-03-04 NOTE — Progress Notes (Signed)
   Subjective: [name] is much more alert this morning. He is able to answer questions with one word answers but is unable tell me where he is or why he is in the hospital. He states that he "just wants to rest." He had multiple episodes of diarrhea throughout the night and was prescribed Imodium PRN. Per nursing, he has not had any new episodes of diarrhea since last night.  Objective:  Vital signs in last 24 hours: Vitals:   03/03/18 1437 03/03/18 2057 03/04/18 0420 03/04/18 0612  BP: 110/69 (!) 153/78  105/63  Pulse: 66 64  65  Resp: 16 18  (!) 22  Temp: 98.3 F (36.8 C) 98 F (36.7 C)  98 F (36.7 C)  TempSrc: Axillary Oral  Oral  SpO2: 90% 97%  94%  Weight:   81.7 kg    Physical Exam Vitals signs and nursing note reviewed.  Constitutional:      Appearance: He is well-developed.  Abdominal:     General: There is no distension.     Tenderness: There is no abdominal tenderness.  Neurological:     Mental Status: He is alert.     Assessment/Plan:  Principal Problem:   Diverticulitis Active Problems:   Intertrigo   Chronic dementia [facility](HCC)  [name] is a 84 year old male with advanced dementia who presented with worsening functional status, abdominal pain, decreased PO intake, and found to have acute diverticulitis. He has been treated with IV fluids and antibiotics with improvement in his mental status and abdominal pain. I spoke with his son yesterday who states that his mental capacity is now back to his baseline. He is tolerating a regular diet and does not have any abdominal pain on exam. He does however require maximum assist per PT/OT. He is medically stable for discharge today but his son would like him to stay in an ALF. I do forsee this being a barrier to discharge and will follow-up with social work.  Diverticulitis: - Continue Augmentin (today abx course: day 4/7) - Tolerating a regular diet. Will continue this today. - Continue Tylenol 650 mg every 6  hours PRN - Continue Senokot  Prominent prostate gland seen on CT: No signs of urinary retention on bladder scan.  We will continue to monitor.  Dementia: He appears comfortable today. Mental status at baseline per son.  - Continue home donezepil - Continue home Seroquel - Continue home trazodone as needed for agitation  FEN: Regular diet VTE prophylaxis: Eliquis CODE STATUS: DNR  Dispo: Anticipated discharge in 0-1 days.  Synetta ShadowPrince, Irine Heminger M, MD 03/04/2018, 11:36 AM Pager: [phone]

## 2018-03-04 NOTE — Progress Notes (Addendum)
Assessment completed.  CSW sent FL-2 and referrals out to SNF's via the hub to the [place] area.  Pt has Medicare A&B with AARP as secondary.  [facility] not likely to take pt back with SNF PT recommendation and weekend CSW unable to reach admissions, CSW sent referrals with permission of pt's son Nelda BucksWickeresty,Jayme at ph: [phone].  Provider updated.  CSW will continue to follow for D/C needs.  Weekend shift CSW will leave handoff for 1st shift CSW.  Dorothe PeaJonathan F. Melanye Hiraldo, LCSW, LCAS, CSI Clinical Social Worker Ph: [phone]

## 2018-03-04 NOTE — [place] FL2 (Signed)
[place] MEDICAID FL2 LEVEL OF CARE SCREENING TOOL     IDENTIFICATION  Patient Name: Paul Cox Birthdate: 12/17/1924 Sex: male Admission Date (Current Location): 03/01/2018  [facility] [place]   Facility and Address:  [facility], [address], [place], [place] [address]      Provider Number: [id number]  Attending Physician Name and Address:  Tyson AliasVincent, Duncan Thomas, *  Relative Name and Phone Number:       Current Level of Care: Hospital Recommended Level of Care: Skilled Nursing Facility Prior Approval Number:    Date Approved/Denied: 03/04/18 PASRR Number: [id number]  Discharge Plan:      Current Diagnoses: Patient Active Problem List   Diagnosis Date Noted  . Acute diarrhea 03/04/2018  . Intertrigo 03/02/2018  . Chronic dementia (HCC) 03/02/2018  . Diverticulitis 03/01/2018    Orientation RESPIRATION BLADDER Height & Weight     Self  Normal Incontinent Weight: 180 lb 1.9 oz (81.7 kg) Height:     BEHAVIORAL SYMPTOMS/MOOD NEUROLOGICAL BOWEL NUTRITION STATUS      Incontinent Diet(Regular)  AMBULATORY STATUS COMMUNICATION OF NEEDS Skin   Extensive Assist Verbally Normal(Moisture-associated redness in the groin area)                       Personal Care Assistance Level of Assistance  Bathing, Dressing Bathing Assistance: Limited assistance   Dressing Assistance: Limited assistance     Functional Limitations Info             SPECIAL CARE FACTORS FREQUENCY  PT (By licensed PT), OT (By licensed OT)     PT Frequency: 5 OT Frequency: 5            Contractures      Additional Factors Info  Code Status, Allergies Code Status Info: DNR Allergies Info: No Known Allergies           Current Medications (03/04/2018):  This is the current hospital active medication list Current Facility-Administered Medications  Medication Dose Route Frequency Provider Last Rate Last Dose  .  acetaminophen (TYLENOL) tablet 650 mg  650 mg Oral Q6H PRN Beola CordMelvin, Alexander, MD       Or  . acetaminophen (TYLENOL) suppository 650 mg  650 mg Rectal Q6H PRN Beola CordMelvin, Alexander, MD      . amoxicillin-clavulanate (AUGMENTIN XR) 1000-62.5 MG per 12 hr tablet 2 tablet  2 tablet Oral Q12H Angelita InglesWinfrey, William B, MD   2 tablet at 03/04/18 [phone]  . apixaban (ELIQUIS) tablet 5 mg  5 mg Oral BID Beola CordMelvin, Alexander, MD   5 mg at 03/04/18 [id number]  . cholecalciferol (VITAMIN D3) tablet 1,000 Units  1,000 Units Oral Daily Beola CordMelvin, Alexander, MD   1,000 Units at 03/04/18 [phone]  . donepezil (ARICEPT) tablet 10 mg  10 mg Oral Daphine DeutscherQHS Melvin, Alexander, MD   10 mg at 03/03/18 2150  . folic acid (FOLVITE) tablet 1 mg  1 mg Oral Daily Beola CordMelvin, Alexander, MD   1 mg at 03/04/18 [id number]  . Gerhardt's butt cream   Topical QID Tyson AliasVincent, Duncan Thomas, MD      . loperamide (IMODIUM) capsule 2 mg  2 mg Oral PRN [place], Carley D, DO   2 mg at 03/04/18 0418  . multivitamin with minerals tablet 1 tablet  1 tablet Oral Daily Beola CordMelvin, Alexander, MD   1 tablet at 03/04/18 [phone]  . nystatin (MYCOSTATIN/NYSTOP) topical powder   Topical Q8H Beola CordMelvin, Alexander, MD      .  QUEtiapine (SEROQUEL) tablet 25 mg  25 mg Oral 2 times per day Beola CordMelvin, Alexander, MD   25 mg at 03/04/18 1323  . QUEtiapine (SEROQUEL) tablet 50 mg  50 mg Oral QHS Beola CordMelvin, Alexander, MD   50 mg at 03/03/18 2147  . senna-docusate (Senokot-S) tablet 1 tablet  1 tablet Oral QHS PRN Beola CordMelvin, Alexander, MD      . traZODone (DESYREL) tablet 25 mg  25 mg Oral Q12H PRN Beola CordMelvin, Alexander, MD   25 mg at 03/02/18 0542  . vitamin B-12 (CYANOCOBALAMIN) tablet 1,000 mcg  1,000 mcg Oral Daily Beola CordMelvin, Alexander, MD   1,000 mcg at 03/04/18 [phone]     Discharge Medications: Please see discharge summary for a list of discharge medications.  Relevant Imaging Results:  Relevant Lab Results:   Additional Information [id number]  Dorothe PeaJonathan F Kinley Dozier, LCSWA

## 2018-03-04 NOTE — Progress Notes (Signed)
CSW received PASRR created today (12/29) [facility]:   [id number] [id number]  CSW will continue to follow for D/C needs.  Dorothe PeaJonathan F. Kristi Hyer, LCSW, LCAS, CSI Clinical Social Worker Ph: [phone]

## 2018-03-04 NOTE — Discharge Summary (Addendum)
Name: Paul Cox MRN: [id number] DOB: Jul 28, 1924 84 y.o. PCP: Patient, No Pcp Per  Date of Admission: 03/01/2018  3:39 PM Date of Discharge: 03/05/2018 Attending Physician: Paul Cox, Paul Cox, *  Discharge Diagnosis: 1. Acute diverticulitis 2. Acute antibiotic associated diarrhea 3. Chronic Dementia 4. Intertrigo 5. Asymptomatic bradycardia  Discharge Medications: Allergies as of 03/04/2018   No Known Allergies     Medication List    TAKE these medications   acetaminophen 500 MG tablet Commonly known as:  TYLENOL Take 500 mg by mouth every 8 (eight) hours as needed (for pain).   amoxicillin-clavulanate 1000-62.5 MG 12 hr tablet Commonly known as:  AUGMENTIN XR Take 2 tablets by mouth every 12 (twelve) hours for 3 days.   donepezil 10 MG tablet Commonly known as:  ARICEPT Take 10 mg by mouth at bedtime.   ELIQUIS 5 MG Tabs tablet Generic drug:  apixaban Take 5 mg by mouth 2 (two) times daily.   folic acid 1 MG tablet Commonly known as:  FOLVITE Take 1 mg by mouth daily.   loperamide 2 MG tablet Commonly known as:  IMODIUM A-D Take 2 mg by mouth every 6 (six) hours as needed for diarrhea or loose stools.   ONE-A-DAY PROACTIVE 65+ Tabs Take 1 tablet by mouth daily.   QUEtiapine 25 MG tablet Commonly known as:  SEROQUEL Take 25 mg by mouth 2 (two) times daily.   QUEtiapine 50 MG tablet Commonly known as:  SEROQUEL Take 50 mg by mouth at bedtime.   traZODone 25 mg Tabs tablet Commonly known as:  DESYREL Take 12.5 mg by mouth every 12 (twelve) hours as needed (for agitation).   vitamin B-12 1000 MCG tablet Commonly known as:  CYANOCOBALAMIN Take 1,000 mcg by mouth daily.   Vitamin D3 25 MCG (1000 UT) Caps Take 1,000 Units by mouth daily.   zolpidem 5 MG tablet Commonly known as:  AMBIEN Take 2.5 mg by mouth at bedtime.       Disposition and follow-up:   Paul Cox [facility] in  Stable condition.  At the hospital follow up visit please address:  1.  Please ensure that Paul Cox completes his 7 day course of antibiotics. Please continue to monitor his PO intake.   2. Please monitor for signs of diarrhea while on antibiotic therapy and give imodium as needed.   3.  Labs / imaging needed at time of follow-up: None  4.  Pending labs/ test needing follow-up: None  Hospital Course by problem list: 1. Acute diverticulitis: Paul Cox was admitted with decreased functional status and abdominal pain due to mild diverticulitis (seen on abdominal CT).  He was treated with IV fluids and antibiotic therapy.  He responded well and per his son he has active his baseline.  Prior to discharge he was tolerating a regular diet.  He was evaluated by PT/OT who recommended maximum assist.  He will need to continue Augmentin for an additional 2.5 days to complete a 7-day course.  2. Acute antibiotic associated diarrhea: He developed diarrhea after starting antibiotic treatment.  C. difficile negative.  His diarrhea is likely due to antibiotic use and resolved with use of Imodium.  He will need to continue Imodium as needed at discharge.  3. Chronic Dementia: He is oriented x1 at baseline.  His mental status remained stable throughout his admission.  His home medications including donezepil, Seroquel, and PRN trazodone were continued. Per his son he was  at his baseline prior to discharge.  He will need to continue these medications at discharge.  4. Intertrigo: Stable throughout admission.  We continued his nystatin powder which he will need to continue on discharge.  5. Prominent prostate gland seen on CT: He was found to have an enlarged prostate gland on CT. He did not have any urinary retention on bladder scan. If he develops worsening renal function or bladder retention, please do a bladder scan as his prominent prostate gland maybe cause.   5. Asymptomatic bradycardia:  Persistent throughout admission. Continuous cardiac monitoring did not show any signs of an arrhythmia. Please continue to monitor his heart rate and rhythm.   Discharge Vitals:   BP 105/63 (BP Location: Right Arm)   Pulse 65   Temp 98 F (36.7 C) (Oral)   Resp (!) 22   Wt 81.7 kg   SpO2 94%   BMI 28.21 kg/m   Pertinent Labs, Studies, and Procedures:  12/26 Abdominal CT: IMPRESSION: 1. Inflammatory changes about diverticula in the sigmoid colon concerning for early acute sigmoid diverticulitis. No abscess, free fluid, or free air is associated. 2. Irregular prominent prostate gland. Please correlate with physical exam and laboratory testing as appropriate. No definite tumor is present. 3.  Aortic Atherosclerosis (ICD10-I70.0). 4. Hepatic steatosis. 5. Cardiomegaly without failure. 6. Coronary artery disease. 7. Small hiatal hernia. 8. Scoliosis and degenerative changes in the lower lumbar spine.  12/26 CXR: IMPRESSION: 1. Borderline cardiomegaly with moderate aortic atherosclerosis. 2. Mild interstitial edema with trace posterior pleural effusions suspected. 3. Bibasilar atelectasis.  Discharge Instructions: Discharge Instructions    Diet - low sodium heart healthy   Complete by:  As directed    Discharge instructions   Complete by:  As directed    Please see discharge instructions.   Increase activity slowly   Complete by:  As directed       Signed: Synetta ShadowPrince, Jamie M, MD 03/05/2018, 6:46 AM   Pager: [phone]

## 2018-03-05 MED ORDER — AMOXICILLIN-POT CLAVULANATE ER 1000-62.5 MG PO TB12
2.0000 | ORAL_TABLET | Freq: Two times a day (BID) | ORAL | Status: DC
  Administered 2018-03-05: 2 via ORAL
  Filled 2018-03-05 (×2): qty 2

## 2018-03-05 MED ORDER — SENNOSIDES-DOCUSATE SODIUM 8.6-50 MG PO TABS: 1.0000 | ORAL_TABLET | Freq: Every evening | ORAL | 0 refills | Status: AC | PRN

## 2018-03-05 MED ORDER — SENNOSIDES-DOCUSATE SODIUM 8.6-50 MG PO TABS: 1.0000 | ORAL_TABLET | Freq: Once | ORAL | Status: DC

## 2018-03-05 NOTE — Social Work (Signed)
CSW spoke to patient's nurse at [facility]. They would like for patient to return to ALF if possible, but needs to be mostly independent with transfers. They indicated that at baseline, patient uses wheelchair, but is able to transfer himself to and from the wheelchair. They asked for PT to see patient again and re-valuate transfer ability.  CSW spoke to patient's son on the phone and discussed conversation with [facility]. Provided SNF bed offers to son. Son will review and have a SNF backup chosen in case ALF is not able to take patient back after PT re-eval.  Did discuss with MD and MD to order PT re-eval.  CSW to follow.  Abigail ButtsSusan Alexsa Flaum, LCSW [phone]

## 2018-03-05 NOTE — Care Management Important Message (Signed)
Important Message  Patient Details  Name: Paul Cox MRN: [id number] Date of Birth: 1924/09/24   Medicare Important Message Given:  Yes    Tammie Ellsworth P Jetta Murray 03/05/2018, 3:29 PM

## 2018-03-05 NOTE — Progress Notes (Signed)
Physical Therapy Treatment Patient Details Name: Paul Cox MRN: [id number] DOB: 08-10-1924 Today's Date: 03/05/2018    History of Present Illness Pt is a 84 y.o. male with advanced dementia admitted from ALF on 03/01/18 with worsening functional status and abdominal pain; worked up for acute diverticulitis. PMH includes dementia, PVD, chronic urinary incontinence.    PT Comments    Pt cooperative with treatment. Pt able to perform transfer from bed to chair and back with only min guard to slight min assist to come up from low chair. Feel pt is close to baseline and feel pt can return to ALF at dc.    Follow Up Recommendations  Other (comment)(return to ALF)     Equipment Recommendations  None recommended by PT    Recommendations for Other Services       Precautions / Restrictions Precautions Precautions: Fall Restrictions Weight Bearing Restrictions: No    Mobility  Bed Mobility Overal bed mobility: Needs Assistance Bed Mobility: Supine to Sit;Sit to Supine     Supine to sit: Min assist Sit to supine: Min guard   General bed mobility comments: Assist for pt to pull trunk upright using therapist hand. Guidance for returning to supine  Transfers Overall transfer level: Needs assistance Equipment used: 1 person hand held assist Transfers: Sit to/from [facility] Sit to Stand: Min assist Stand pivot transfers: Min guard       General transfer comment: Pt performed bed to chair to bed with min assist to bring hips up and guidance to perform pivot. Pt with flexed posture with stand and pivot  Ambulation/Gait                 Stairs             Wheelchair Mobility    Modified Rankin (Stroke Patients Only)       Balance Overall balance assessment: Needs assistance Sitting-balance support: No upper extremity supported;Feet supported Sitting balance-Leahy Scale: Fair     Standing balance support: Single extremity  supported Standing balance-Leahy Scale: Poor Standing balance comment: UE support and min guard for static standing. Standing is flexed posture                            Cognition Arousal/Alertness: Awake/alert;Lethargic(able to maintain arousal for mobility) Behavior During Therapy: WFL for tasks assessed/performed Overall Cognitive Status: History of cognitive impairments - at baseline                                        Exercises      General Comments        Pertinent Vitals/Pain Pain Assessment: Faces Faces Pain Scale: No hurt    Home Living                      Prior Function            PT Goals (current goals can now be found in the care plan section) Acute Rehab PT Goals PT Goal Formulation: Patient unable to participate in goal setting Time For Goal Achievement: 03/19/18 Progress towards PT goals: Goals met and updated - see care plan    Frequency    Min 2X/week      PT Plan Discharge plan needs to be updated    Co-evaluation  AM-PAC PT "6 Clicks" Mobility   Outcome Measure  Help needed turning from your back to your side while in a flat bed without using bedrails?: A Little Help needed moving from lying on your back to sitting on the side of a flat bed without using bedrails?: A Little Help needed moving to and from a bed to a chair (including a wheelchair)?: A Little Help needed standing up from a chair using your arms (e.g., wheelchair or bedside chair)?: A Little Help needed to walk in hospital room?: Total Help needed climbing 3-5 steps with a railing? : Total 6 Click Score: 14    End of Session Equipment Utilized During Treatment: Gait belt Activity Tolerance: Patient tolerated treatment well Patient left: in bed;with call bell/phone within reach;with bed alarm set Nurse Communication: Mobility status PT Visit Diagnosis: Other abnormalities of gait and mobility (R26.89);Muscle  weakness (generalized) (M62.81)     Time: 0148-4039 PT Time Calculation (min) (ACUTE ONLY): 21 min  Charges:  $Therapeutic Activity: 8-22 mins                     [place] Pager [phone] Office [place] 03/05/2018, 2:59 PM

## 2018-03-05 NOTE — NC FL2 (Addendum)
[place] MEDICAID FL2 LEVEL OF CARE SCREENING TOOL     IDENTIFICATION  Patient Name: Paul Cox Birthdate: [birth year] Sex: male Admission Date (Current Location): 03/01/2018  [facility] [place]  [place]   Facility and Address:  [facility] [place], [address], [place], [place] [address]      Provider Number: [id number]  Attending Physician Name and Address:  Burns SpainButcher, Elizabeth A, MD  Relative Name and Phone Number:       Current Level of Care: Hospital Recommended Level of Care: Assisted Living Facility Prior Approval Number:    Date Approved/Denied: 03/04/18 PASRR Number: [id number] [id number]  Discharge Plan: Domiciliary (Rest home)(ALF)    Current Diagnoses: Patient Active Problem List   Diagnosis Date Noted  . Acute diarrhea 03/04/2018  . Intertrigo 03/02/2018  . Chronic dementia (HCC) 03/02/2018  . Diverticulitis 03/01/2018    Orientation RESPIRATION BLADDER Height & Weight     Self  Normal Incontinent Weight: 81.3 kg Height:     BEHAVIORAL SYMPTOMS/MOOD NEUROLOGICAL BOWEL NUTRITION STATUS      Incontinent Diet(Regular)  AMBULATORY STATUS COMMUNICATION OF NEEDS Skin   Extensive Assist(baseline) Verbally Normal(Moisture-associated redness in the groin area)                       Personal Care Assistance Level of Assistance  Bathing, Feeding, Dressing Bathing Assistance: Limited assistance Feeding assistance: Independent Dressing Assistance: Limited assistance     Functional Limitations Info             SPECIAL CARE FACTORS FREQUENCY  PT (By licensed PT), OT (By licensed OT)     PT Frequency: 3x/week OT Frequency: 3x/week            Contractures Contractures Info: Not present    Additional Factors Info  Code Status, Allergies Code Status Info: DNR Allergies Info: No Known Allergies           Current Medications (03/05/2018):  This is the current hospital active medication list Current  Facility-Administered Medications  Medication Dose Route Frequency Provider Last Rate Last Dose  . acetaminophen (TYLENOL) tablet 650 mg  650 mg Oral Q6H PRN Beola CordMelvin, Alexander, MD       Or  . acetaminophen (TYLENOL) suppository 650 mg  650 mg Rectal Q6H PRN Beola CordMelvin, Alexander, MD      . amoxicillin-clavulanate (AUGMENTIN XR) 1000-62.5 MG per 12 hr tablet 2 tablet  2 tablet Oral Q12H Synetta ShadowPrince, Jamie M, MD   2 tablet at 03/05/18 [phone]  . apixaban (ELIQUIS) tablet 5 mg  5 mg Oral BID Beola CordMelvin, Alexander, MD   5 mg at 03/05/18 0930  . cholecalciferol (VITAMIN D3) tablet 1,000 Units  1,000 Units Oral Daily Beola CordMelvin, Alexander, MD   1,000 Units at 03/05/18 0930  . donepezil (ARICEPT) tablet 10 mg  10 mg Oral Daphine DeutscherQHS Melvin, Alexander, MD   10 mg at 03/04/18 2146  . folic acid (FOLVITE) tablet 1 mg  1 mg Oral Daily Beola CordMelvin, Alexander, MD   1 mg at 03/05/18 0930  . Gerhardt's butt cream   Topical QID Tyson AliasVincent, Duncan Thomas, MD      . multivitamin with minerals tablet 1 tablet  1 tablet Oral Daily Beola CordMelvin, Alexander, MD   1 tablet at 03/04/18 [phone]  . nystatin (MYCOSTATIN/NYSTOP) topical powder   Topical Q8H Beola CordMelvin, Alexander, MD      . QUEtiapine (SEROQUEL) tablet 25 mg  25 mg Oral 2 times per day Beola CordMelvin, Alexander, MD  25 mg at 03/05/18 1315  . QUEtiapine (SEROQUEL) tablet 50 mg  50 mg Oral QHS Beola CordMelvin, Alexander, MD   50 mg at 03/04/18 2146  . senna-docusate (Senokot-S) tablet 1 tablet  1 tablet Oral QHS PRN Beola CordMelvin, Alexander, MD      . senna-docusate (Senokot-S) tablet 1 tablet  1 tablet Oral Once Synetta ShadowPrince, Jamie M, MD      . traZODone (DESYREL) tablet 25 mg  25 mg Oral Q12H PRN Beola CordMelvin, Alexander, MD   25 mg at 03/02/18 0542  . vitamin B-12 (CYANOCOBALAMIN) tablet 1,000 mcg  1,000 mcg Oral Daily Beola CordMelvin, Alexander, MD   1,000 mcg at 03/05/18 0930     Discharge Medications: Please see discharge summary for a list of discharge medications.  Relevant Imaging Results:  Relevant Lab Results:   Additional  Information SSN: [id number]  Abigail ButtsSusan Tekla Malachowski, LCSW

## 2018-03-05 NOTE — Progress Notes (Signed)
Patient will discharge to [facility] Anticipated discharge date: 03/05/18 Family notified: Noah CharonEugene Debellis Jr, son Transportation by: PTAR  Nurse to call report to [phone].  CSW signing off.  Abigail ButtsSusan Malillany Kazlauskas, LCSWA  Clinical Social Worker

## 2018-03-05 NOTE — Progress Notes (Signed)
PTAR will receive patient's discharge information and DNR form prior to leaving. RN called report to [facility]. Patient IV was removed.

## 2018-03-05 NOTE — Progress Notes (Signed)
   Subjective: Paul Cox is very hard of hearing and has difficulty understanding me.  He asks multiple times if I can speak up.  He is unable to answer any of my questions but I suspect that it is because he cannot adequately hear me.  Per nursing staff he has not had a bowel movement in over 2 days.  Objective:  Vital signs in last 24 hours: Vitals:   03/04/18 0612 03/04/18 2022 03/05/18 0601 03/05/18 0620  BP: 105/63 (!) 125/53 (!) 101/49   Pulse: 65 64 60   Resp: (!) 22 20 20    Temp: 98 F (36.7 C) 98.2 F (36.8 C) 98.2 F (36.8 C)   TempSrc: Oral Oral Oral   SpO2: 94% 94% 91%   Weight:    81.3 kg   Physical Exam Vitals signs and nursing note reviewed.  Constitutional:      Appearance: He is well-developed.  Neurological:     Mental Status: He is alert.     Comments: Alert, unable to test orientation due to the fact that he is hard of hearing.  Psychiatric:        Mood and Affect: Mood normal.        Behavior: Behavior normal.     Assessment/Plan:  Principal Problem:   Diverticulitis Active Problems:   Intertrigo   Chronic dementia (HCC)   Acute diarrhea  Paul Cox is a 84 year old male with advanced dementia who presented with worsening functional status, abdominal pain, decreased p.o. intake, and found to have acute diverticulitis.  He has been treated with IV fluids and antibiotics with improvement in his mental status and abdominal pain.  He is tolerating a regular diet and is overall doing well.  He will be discharged today to a SNF and will continue Augmentin for an additional 2 days.  Diverticulitis: - Continue PO Augmentin (antibiotic course 5/7) - Continue regular diet - Continue Tylenol 650 mg every 6 hours PRN - Ordered 1 dose of Senokot for constipation  Prominent prostate gland seen on CT: No signs of urinary retention.  We will continue to monitor.  Dementia: He remains at his baseline. - Continue home donezepil -Continue home  Seroquel -Continue home trazodone as needed for agitation  FEN: Regular diet VTE prophylaxis: Eliquis CODE STATUS: DNR  Dispo: Anticipated discharge today.  Synetta ShadowPrince, Jamie M, MD 03/05/2018, 7:39 AM Pager: [phone]

## 2019-03-11 ENCOUNTER — Emergency Department (HOSPITAL_COMMUNITY): Payer: No Typology Code available for payment source

## 2019-03-11 ENCOUNTER — Encounter (HOSPITAL_COMMUNITY): Payer: Self-pay | Admitting: *Deleted

## 2019-03-11 ENCOUNTER — Inpatient Hospital Stay (HOSPITAL_COMMUNITY)
Admission: EM | Admit: 2019-03-11 | Discharge: 2019-04-08 | DRG: 871 | Disposition: E | Payer: No Typology Code available for payment source | Source: Skilled Nursing Facility | Attending: Internal Medicine | Admitting: Internal Medicine

## 2019-03-11 ENCOUNTER — Other Ambulatory Visit: Payer: Self-pay

## 2019-03-11 DIAGNOSIS — G309 Alzheimer's disease, unspecified: Secondary | ICD-10-CM | POA: Diagnosis present

## 2019-03-11 DIAGNOSIS — E872 Acidosis, unspecified: Secondary | ICD-10-CM | POA: Diagnosis present

## 2019-03-11 DIAGNOSIS — Z515 Encounter for palliative care: Secondary | ICD-10-CM | POA: Diagnosis present

## 2019-03-11 DIAGNOSIS — N390 Urinary tract infection, site not specified: Secondary | ICD-10-CM

## 2019-03-11 DIAGNOSIS — F0390 Unspecified dementia without behavioral disturbance: Secondary | ICD-10-CM | POA: Diagnosis present

## 2019-03-11 DIAGNOSIS — Z66 Do not resuscitate: Secondary | ICD-10-CM | POA: Diagnosis present

## 2019-03-11 DIAGNOSIS — R0600 Dyspnea, unspecified: Secondary | ICD-10-CM

## 2019-03-11 DIAGNOSIS — J9601 Acute respiratory failure with hypoxia: Secondary | ICD-10-CM | POA: Diagnosis not present

## 2019-03-11 DIAGNOSIS — R0602 Shortness of breath: Secondary | ICD-10-CM | POA: Diagnosis not present

## 2019-03-11 DIAGNOSIS — Z7189 Other specified counseling: Secondary | ICD-10-CM | POA: Diagnosis not present

## 2019-03-11 DIAGNOSIS — R0902 Hypoxemia: Secondary | ICD-10-CM | POA: Diagnosis present

## 2019-03-11 DIAGNOSIS — N179 Acute kidney failure, unspecified: Secondary | ICD-10-CM | POA: Diagnosis present

## 2019-03-11 DIAGNOSIS — E559 Vitamin D deficiency, unspecified: Secondary | ICD-10-CM | POA: Diagnosis present

## 2019-03-11 DIAGNOSIS — Z87440 Personal history of urinary (tract) infections: Secondary | ICD-10-CM

## 2019-03-11 DIAGNOSIS — A419 Sepsis, unspecified organism: Secondary | ICD-10-CM | POA: Diagnosis present

## 2019-03-11 DIAGNOSIS — Z20822 Contact with and (suspected) exposure to covid-19: Secondary | ICD-10-CM | POA: Diagnosis present

## 2019-03-11 DIAGNOSIS — R9431 Abnormal electrocardiogram [ECG] [EKG]: Secondary | ICD-10-CM | POA: Diagnosis present

## 2019-03-11 DIAGNOSIS — I959 Hypotension, unspecified: Secondary | ICD-10-CM

## 2019-03-11 DIAGNOSIS — G9341 Metabolic encephalopathy: Secondary | ICD-10-CM | POA: Diagnosis present

## 2019-03-11 DIAGNOSIS — I739 Peripheral vascular disease, unspecified: Secondary | ICD-10-CM | POA: Diagnosis present

## 2019-03-11 DIAGNOSIS — N3000 Acute cystitis without hematuria: Secondary | ICD-10-CM | POA: Diagnosis present

## 2019-03-11 DIAGNOSIS — N320 Bladder-neck obstruction: Secondary | ICD-10-CM | POA: Diagnosis present

## 2019-03-11 DIAGNOSIS — Z86718 Personal history of other venous thrombosis and embolism: Secondary | ICD-10-CM

## 2019-03-11 DIAGNOSIS — Z7901 Long term (current) use of anticoagulants: Secondary | ICD-10-CM | POA: Diagnosis not present

## 2019-03-11 DIAGNOSIS — J189 Pneumonia, unspecified organism: Secondary | ICD-10-CM | POA: Diagnosis not present

## 2019-03-11 DIAGNOSIS — J690 Pneumonitis due to inhalation of food and vomit: Secondary | ICD-10-CM | POA: Diagnosis not present

## 2019-03-11 DIAGNOSIS — R6520 Severe sepsis without septic shock: Secondary | ICD-10-CM | POA: Diagnosis not present

## 2019-03-11 DIAGNOSIS — F0280 Dementia in other diseases classified elsewhere without behavioral disturbance: Secondary | ICD-10-CM | POA: Diagnosis present

## 2019-03-11 LAB — CBC WITH DIFFERENTIAL/PLATELET
Abs Immature Granulocytes: 0.29 10*3/uL — ABNORMAL HIGH (ref 0.00–0.07)
Basophils Absolute: 0 10*3/uL (ref 0.0–0.1)
Basophils Relative: 0 %
Eosinophils Absolute: 0 10*3/uL (ref 0.0–0.5)
Eosinophils Relative: 0 %
HCT: 37.4 % — ABNORMAL LOW (ref 39.0–52.0)
Hemoglobin: 11.9 g/dL — ABNORMAL LOW (ref 13.0–17.0)
Immature Granulocytes: 1 %
Lymphocytes Relative: 2 %
Lymphs Abs: 0.5 10*3/uL — ABNORMAL LOW (ref 0.7–4.0)
MCH: 30.7 pg (ref 26.0–34.0)
MCHC: 31.8 g/dL (ref 30.0–36.0)
MCV: 96.6 fL (ref 80.0–100.0)
Monocytes Absolute: 1.5 10*3/uL — ABNORMAL HIGH (ref 0.1–1.0)
Monocytes Relative: 5 %
Neutro Abs: 24.8 10*3/uL — ABNORMAL HIGH (ref 1.7–7.7)
Neutrophils Relative %: 92 %
Platelets: 201 10*3/uL (ref 150–400)
RBC: 3.87 MIL/uL — ABNORMAL LOW (ref 4.22–5.81)
RDW: 13.5 % (ref 11.5–15.5)
WBC: 27 10*3/uL — ABNORMAL HIGH (ref 4.0–10.5)
nRBC: 0 % (ref 0.0–0.2)

## 2019-03-11 LAB — CBC
HCT: 30.8 % — ABNORMAL LOW (ref 39.0–52.0)
Hemoglobin: 10 g/dL — ABNORMAL LOW (ref 13.0–17.0)
MCH: 31.3 pg (ref 26.0–34.0)
MCHC: 32.5 g/dL (ref 30.0–36.0)
MCV: 96.6 fL (ref 80.0–100.0)
Platelets: 227 10*3/uL (ref 150–400)
RBC: 3.19 MIL/uL — ABNORMAL LOW (ref 4.22–5.81)
RDW: 14.1 % (ref 11.5–15.5)
WBC: 22 10*3/uL — ABNORMAL HIGH (ref 4.0–10.5)
nRBC: 0 % (ref 0.0–0.2)

## 2019-03-11 LAB — RESPIRATORY PANEL BY RT PCR (FLU A&B, COVID)
Influenza A by PCR: NEGATIVE
Influenza B by PCR: NEGATIVE
SARS Coronavirus 2 by RT PCR: NEGATIVE

## 2019-03-11 LAB — COMPREHENSIVE METABOLIC PANEL
ALT: 17 U/L (ref 0–44)
AST: 32 U/L (ref 15–41)
Albumin: 3.4 g/dL — ABNORMAL LOW (ref 3.5–5.0)
Alkaline Phosphatase: 47 U/L (ref 38–126)
Anion gap: 12 (ref 5–15)
BUN: 27 mg/dL — ABNORMAL HIGH (ref 8–23)
CO2: 21 mmol/L — ABNORMAL LOW (ref 22–32)
Calcium: 8.9 mg/dL (ref 8.9–10.3)
Chloride: 108 mmol/L (ref 98–111)
Creatinine, Ser: 1.5 mg/dL — ABNORMAL HIGH (ref 0.61–1.24)
GFR calc Af Amer: 46 mL/min — ABNORMAL LOW (ref >60)
GFR calc non Af Amer: 40 mL/min — ABNORMAL LOW (ref >60)
Glucose, Bld: 119 mg/dL — ABNORMAL HIGH (ref 70–99)
Potassium: 4.2 mmol/L (ref 3.5–5.1)
Sodium: 141 mmol/L (ref 135–145)
Total Bilirubin: 0.9 mg/dL (ref 0.3–1.2)
Total Protein: 6.8 g/dL (ref 6.5–8.1)

## 2019-03-11 LAB — LACTIC ACID, PLASMA
Lactic Acid, Venous: 3.9 mmol/L (ref 0.5–1.9)
Lactic Acid, Venous: 3.4 mmol/L (ref 0.5–1.9)
Lactic Acid, Venous: 2.6 mmol/L (ref 0.5–1.9)

## 2019-03-11 LAB — URINALYSIS, ROUTINE W REFLEX MICROSCOPIC
Bilirubin Urine: NEGATIVE
Glucose, UA: NEGATIVE mg/dL
Ketones, ur: 5 mg/dL — AB
Nitrite: NEGATIVE
Protein, ur: 30 mg/dL — AB
Specific Gravity, Urine: 1.035 — ABNORMAL HIGH (ref 1.005–1.030)
WBC, UA: >50 WBC/hpf — ABNORMAL HIGH (ref 0–5)
pH: 5 (ref 5.0–8.0)

## 2019-03-11 LAB — PROCALCITONIN: Procalcitonin: 37.79 ng/mL

## 2019-03-11 LAB — PROTIME-INR
INR: 1.5 — ABNORMAL HIGH (ref 0.8–1.2)
Prothrombin Time: 17.6 seconds — ABNORMAL HIGH (ref 11.4–15.2)

## 2019-03-11 LAB — POC SARS CORONAVIRUS 2 AG -  ED: SARS Coronavirus 2 Ag: NEGATIVE

## 2019-03-11 LAB — MAGNESIUM: Magnesium: 1.9 mg/dL (ref 1.7–2.4)

## 2019-03-11 LAB — CORTISOL: Cortisol, Plasma: 53.4 ug/dL

## 2019-03-11 MED ORDER — SODIUM CHLORIDE 0.9 % IV BOLUS (SEPSIS)
500.0000 mL | Freq: Once | INTRAVENOUS | Status: AC
  Administered 2019-03-11: 500 mL via INTRAVENOUS

## 2019-03-11 MED ORDER — SODIUM CHLORIDE 0.9 % IV SOLN
2.0000 g | INTRAVENOUS | Status: AC
  Administered 2019-03-11: 2 g via INTRAVENOUS
  Filled 2019-03-11: qty 2

## 2019-03-11 MED ORDER — ACETAMINOPHEN 325 MG PO TABS: 650.0000 mg | ORAL_TABLET | Freq: Four times a day (QID) | ORAL | Status: DC | PRN

## 2019-03-11 MED ORDER — ACETAMINOPHEN 500 MG PO TABS: 1000.0000 mg | ORAL_TABLET | Freq: Once | ORAL | Status: DC

## 2019-03-11 MED ORDER — SODIUM CHLORIDE (PF) 0.9 % IJ SOLN
INTRAMUSCULAR | Status: AC
  Filled 2019-03-11: qty 50

## 2019-03-11 MED ORDER — ACETAMINOPHEN 650 MG RE SUPP: 975.0000 mg | Freq: Once | RECTAL | Status: DC

## 2019-03-11 MED ORDER — MAGNESIUM SULFATE IN D5W 1-5 GM/100ML-% IV SOLN
1.0000 g | Freq: Once | INTRAVENOUS | Status: AC
  Administered 2019-03-12: 1 g via INTRAVENOUS
  Filled 2019-03-11: qty 100

## 2019-03-11 MED ORDER — VANCOMYCIN HCL 1250 MG/250ML IV SOLN
1250.0000 mg | INTRAVENOUS | Status: DC
  Administered 2019-03-13: 1250 mg via INTRAVENOUS
  Filled 2019-03-11: qty 250

## 2019-03-11 MED ORDER — QUETIAPINE FUMARATE 25 MG PO TABS: 25.0000 mg | ORAL_TABLET | Freq: Two times a day (BID) | ORAL | Status: DC

## 2019-03-11 MED ORDER — SODIUM CHLORIDE 0.9 % IV BOLUS (SEPSIS)
1000.0000 mL | Freq: Once | INTRAVENOUS | Status: AC
  Administered 2019-03-11: 1000 mL via INTRAVENOUS

## 2019-03-11 MED ORDER — ASPIRIN 300 MG RE SUPP
300.0000 mg | Freq: Once | RECTAL | Status: AC
  Administered 2019-03-11: 300 mg via RECTAL
  Filled 2019-03-11: qty 1

## 2019-03-11 MED ORDER — SODIUM CHLORIDE 0.9 % IV BOLUS
1000.0000 mL | Freq: Once | INTRAVENOUS | Status: AC
  Administered 2019-03-11: 1000 mL via INTRAVENOUS

## 2019-03-11 MED ORDER — ONE-A-DAY PROACTIVE 65+ PO TABS: 1.0000 | ORAL_TABLET | Freq: Every day | ORAL | Status: DC

## 2019-03-11 MED ORDER — SODIUM CHLORIDE 0.9 % IV BOLUS (SEPSIS): 1000.0000 mL | Freq: Once | INTRAVENOUS | Status: DC

## 2019-03-11 MED ORDER — VANCOMYCIN HCL 1750 MG/350ML IV SOLN
1750.0000 mg | INTRAVENOUS | Status: AC
  Administered 2019-03-11: 1750 mg via INTRAVENOUS
  Filled 2019-03-11: qty 350

## 2019-03-11 MED ORDER — ACETAMINOPHEN 650 MG RE SUPP
RECTAL | Status: AC
  Administered 2019-03-11: 975 mg via RECTAL
  Filled 2019-03-11: qty 1

## 2019-03-11 MED ORDER — ACETAMINOPHEN 650 MG RE SUPP: 650.0000 mg | Freq: Four times a day (QID) | RECTAL | Status: DC | PRN

## 2019-03-11 MED ORDER — ACETAMINOPHEN 325 MG RE SUPP
RECTAL | Status: AC
  Filled 2019-03-11: qty 1

## 2019-03-11 MED ORDER — APIXABAN 5 MG PO TABS
5.0000 mg | ORAL_TABLET | Freq: Two times a day (BID) | ORAL | Status: DC
  Filled 2019-03-11 (×4): qty 1

## 2019-03-11 MED ORDER — SODIUM CHLORIDE 0.9 % IV SOLN
1.0000 g | INTRAVENOUS | Status: DC
  Administered 2019-03-11: 1 g via INTRAVENOUS
  Filled 2019-03-11: qty 10

## 2019-03-11 MED ORDER — HYDROCODONE-ACETAMINOPHEN 5-325 MG PO TABS: 1.0000 | ORAL_TABLET | ORAL | Status: DC | PRN

## 2019-03-11 MED ORDER — IOHEXOL 300 MG/ML  SOLN
100.0000 mL | Freq: Once | INTRAMUSCULAR | Status: AC | PRN
  Administered 2019-03-11: 80 mL via INTRAVENOUS

## 2019-03-11 MED ORDER — DONEPEZIL HCL 10 MG PO TABS
10.0000 mg | ORAL_TABLET | Freq: Every day | ORAL | Status: DC
  Filled 2019-03-11: qty 1

## 2019-03-11 MED ORDER — VITAMIN D3 25 MCG (1000 UNIT) PO TABS
1000.0000 [IU] | ORAL_TABLET | Freq: Every day | ORAL | Status: DC
  Filled 2019-03-11: qty 1

## 2019-03-11 MED ORDER — ONDANSETRON HCL 4 MG/2ML IJ SOLN
INTRAMUSCULAR | Status: AC
  Filled 2019-03-11: qty 2

## 2019-03-11 MED ORDER — SODIUM CHLORIDE 0.9 % IV SOLN: 1000.0000 mL | INTRAVENOUS | Status: DC

## 2019-03-11 MED ORDER — ACETAMINOPHEN 650 MG RE SUPP
975.0000 mg | Freq: Once | RECTAL | Status: AC
  Administered 2019-03-11: 975 mg via RECTAL
  Filled 2019-03-11: qty 1

## 2019-03-11 MED ORDER — VITAMIN B-12 1000 MCG PO TABS
1000.0000 ug | ORAL_TABLET | Freq: Every day | ORAL | Status: DC
  Filled 2019-03-11: qty 1

## 2019-03-11 MED ORDER — ONDANSETRON HCL 4 MG PO TABS: 4.0000 mg | ORAL_TABLET | Freq: Four times a day (QID) | ORAL | Status: DC | PRN

## 2019-03-11 MED ORDER — ONDANSETRON HCL 4 MG/2ML IJ SOLN: 4.0000 mg | Freq: Four times a day (QID) | INTRAMUSCULAR | Status: DC | PRN

## 2019-03-11 MED ORDER — SODIUM CHLORIDE 0.9 % IV SOLN: INTRAVENOUS | Status: DC

## 2019-03-11 MED ORDER — SODIUM CHLORIDE 0.9 % IV SOLN
2.0000 g | Freq: Two times a day (BID) | INTRAVENOUS | Status: DC
  Administered 2019-03-12 – 2019-03-13 (×3): 2 g via INTRAVENOUS
  Filled 2019-03-11 (×3): qty 2

## 2019-03-11 MED ORDER — LACTATED RINGERS IV BOLUS
1000.0000 mL | Freq: Once | INTRAVENOUS | Status: AC
  Administered 2019-03-11: 1000 mL via INTRAVENOUS

## 2019-03-11 MED ORDER — ONDANSETRON HCL 4 MG/2ML IJ SOLN
4.0000 mg | Freq: Once | INTRAMUSCULAR | Status: AC
  Administered 2019-03-11: 4 mg via INTRAVENOUS
  Filled 2019-03-11: qty 2

## 2019-03-11 MED ORDER — SODIUM CHLORIDE 0.9 % IV SOLN
500.0000 mg | INTRAVENOUS | Status: DC
  Administered 2019-03-11 – 2019-03-12 (×2): 500 mg via INTRAVENOUS
  Filled 2019-03-11 (×2): qty 500

## 2019-03-11 NOTE — ED Notes (Addendum)
Rai MD considering vasopressors and/or need for central line for pt related to ongoing BP after multiple boluses; per Rai MD, PA to come assess pt at 7pm regarding plan of care.

## 2019-03-11 NOTE — Progress Notes (Signed)
Pharmacy Antibiotic Note  Paul Cox is a 86 y.o. male admitted on 04/01/2019 with sepsis secondary to UTI, pneumonia/aspiration. Patient initially placed on Ceftriaxone and Azithromycin. Ceftriaxone now broadened to Vancomycin and Cefepime. Pharmacy has been consulted for dosing.  Plan: Vancomycin 1750mg  IV x 1, then 1250mg  IV q36h. Vancomycin levels at steady state, as indicated. Cefepime 2g IV q12h. Azithromycin 500mg  IV q24h per MD. Monitor renal function, cultures, clinical course.   Height: 5\' 6"  (167.6 cm) Weight: 175 lb (79.4 kg) IBW/kg (Calculated) : 63.8  Temp (24hrs), Avg:100 F (37.8 C), Min:98.8 F (37.1 C), Max:101.1 F (38.4 C)  Recent Labs  Lab 03/17/2019 0949 03/14/2019 1206  WBC 27.0*  --   CREATININE 1.50*  --   LATICACIDVEN 3.9* 3.4*    Estimated Creatinine Clearance: 31.1 mL/min (A) (by C-G formula based on SCr of 1.5 mg/dL (H)).    No Known Allergies  Antimicrobials this admission: 1/4 Ceftriaxone x 1 1/4 Azithromycin >> 1/4 Vancomycin >> 1/4 Cefepime >>  Dose adjustments this admission: --  Microbiology results: 1/4 BCx: sent 1/4 UCx: sent 1/4 COVID: negative 1/4 Influenza A/B: negative  Thank you for allowing pharmacy to be a part of this patient's care.    , PharmD, BCPS Clinical Pharmacist  03/26/2019 6:59 PM

## 2019-03-11 NOTE — H&P (Addendum)
History and Physical        Hospital Admission Note Date: 03/14/2019  Patient name: Paul Cox [facility] Medical record number: [id number] Date of birth: [birth year] Age: 86 y.o. Gender: male  PCP: [facility]    Patient coming from: Skilled nursing facility  I have reviewed all records in the [facility].    Chief Complaint:  Sent from SNF for fever, low BP, UTI   HPI: Patient is a 86 year old male with history of advanced Alzheimer's dementia, PVD, frequent UTIs, vitamin D deficiency was sent from skilled nursing facility for low BP, fevers and UTI.  Patient has severe dementia and unable to give any history.  Per EMS, nursing home report, patient had some vomiting and was noticed to have strong smelling urine.  He was noticed to have low-grade temp, O2 sats 90% on room air and was placed on 2 L. I was unable to reach patient's son on the phone. COVID-19 test, influenza panel negative  ED work-up/course:  In ED, temp 101.1 F.  lowest BP 94/77, respiratory rate 27, O2 sats 98% on 2 L WBCs 27.0, hemoglobin 11.9, hematocrit 37.4, platelets 201 Lactic acid 3.9, improving to 3.4 Sodium 141, CO2 31, BUN 27, creatinine 1.5, creatinine 1.07 on 03/04/2018  Review of Systems: Positives marked in 'bold' Unable to review with the patient due to mental status   Past Medical History: Past Medical History:  Diagnosis Date  . Dementia (HCC)   . PVD (peripheral vascular disease) (HCC)   . Unsteadiness on feet   . Urinary incontinence   . Vitamin D deficiency     History reviewed. No pertinent surgical history.  Medications: Prior to Admission medications   Medication Sig Start Date End Date Taking? Authorizing Provider  acetaminophen (TYLENOL) 500 MG tablet Take 1,000 mg by mouth every 8 (eight) hours as needed (for pain).    Yes [provider]    apixaban (ELIQUIS) 5 MG TABS tablet Take 5 mg by mouth 2 (two) times daily.   Yes [provider]  Cholecalciferol (VITAMIN D3) 25 MCG (1000 UT) CAPS Take 1,000 Units by mouth daily.   Yes [provider]  donepezil (ARICEPT) 10 MG tablet Take 10 mg by mouth at bedtime.   Yes [provider]  loperamide (IMODIUM A-D) 2 MG tablet Take 2 mg by mouth every 6 (six) hours as needed for diarrhea or loose stools.   Yes [provider]  Multiple Vitamins-Minerals (ONE-A-DAY PROACTIVE 65+) TABS Take 1 tablet by mouth daily.   Yes [provider]  QUEtiapine (SEROQUEL) 25 MG tablet Take 25 mg by mouth 2 (two) times daily.   Yes [provider]  QUEtiapine (SEROQUEL) 25 MG tablet Take 25 mg by mouth 2 (two) times daily.    Yes [provider]  Skin Protectants, Misc. (BAZA PROTECT EX) Apply 1 application topically 3 (three) times daily. Apply to bottom every shift for redness   Yes [provider]  traZODone (DESYREL) 25 mg TABS tablet Take 12.5 mg by mouth every 12 (twelve) hours as needed (for agitation).   Yes [provider]  vitamin B-12 (CYANOCOBALAMIN) 1000 MCG tablet Take  1,000 mcg by mouth daily.   Yes [provider]  zolpidem (AMBIEN) 5 MG tablet Take 2.5 mg by mouth at bedtime.   Yes [provider]  senna-docusate (SENOKOT-S) 8.6-50 MG tablet Take 1 tablet by mouth at bedtime as needed for mild constipation. Patient not taking: Reported on 03-26-2019 03/05/18   Synetta Shadow, MD    Allergies:  No Known Allergies  Social History: Per chart review, has not smoked previously, previous alcohol use. He reports previous drug use.  Family History: Patient has dementia unable to provide any family history   Physical Exam: Blood pressure (!) 144/129, pulse 81, temperature (!) 101.1 F (38.4 C), temperature source Rectal, resp. rate (!) 22, height 5\' 6"  (1.676 m), weight 79.4 kg, SpO2 97  %. General: Alert, awake, pleasant however confused, has dementia Eyes: pink conjunctiva,anicteric sclera, pupils equal and reactive to light and accomodation, HEENT: normocephalic, atraumatic, oropharynx clear Neck: supple, no masses or lymphadenopathy, no goiter, no bruits, no JVD CVS: Regular rate and rhythm, without murmurs, rubs or gallops. No lower extremity edema Resp : Clear to auscultation bilaterally, no wheezing, rales or rhonchi. GI : Soft, nontender, nondistended, positive bowel sounds, no masses. No hepatomegaly. No hernia.  Musculoskeletal: No clubbing or cyanosis, positive pedal pulses. No contracture. ROM intact  Neuro: Does not follow commands however moving all 4 extremities spontaneously Psych: Advanced dementia Skin: no rashes or lesions, warm and dry   LABS on Admission: I have personally reviewed all the labs and imagings below    Basic Metabolic Panel: Recent Labs  Lab March 26, 2019 0949  NA 141  K 4.2  CL 108  CO2 21*  GLUCOSE 119*  BUN 27*  CREATININE 1.50*  CALCIUM 8.9   Liver Function Tests: Recent Labs  Lab 2019-03-26 0949  AST 32  ALT 17  ALKPHOS 47  BILITOT 0.9  PROT 6.8  ALBUMIN 3.4*   No results for input(s): LIPASE, AMYLASE in the last 168 hours. No results for input(s): AMMONIA in the last 168 hours. CBC: Recent Labs  Lab 26-Mar-2019 0949  WBC 27.0*  NEUTROABS 24.8*  HGB 11.9*  HCT 37.4*  MCV 96.6  PLT 201   Cardiac Enzymes: No results for input(s): CKTOTAL, CKMB, CKMBINDEX, TROPONINI in the last 168 hours. BNP: Invalid input(s): POCBNP CBG: No results for input(s): GLUCAP in the last 168 hours.  Radiological Exams on Admission:  CT ABDOMEN PELVIS W CONTRAST  Result Date: 26-Mar-2019 CLINICAL DATA:  Hip Dom pain with diverticulitis suspected. Question UTI with emesis. Strong smelling urine EXAM: CT ABDOMEN AND PELVIS WITH CONTRAST TECHNIQUE: Multidetector CT imaging of the abdomen and pelvis was performed using the standard  protocol following bolus administration of intravenous contrast. CONTRAST:  50mL OMNIPAQUE IOHEXOL 300 MG/ML  SOLN COMPARISON:  03/01/2018 FINDINGS: Lower chest: Remote left ventricular apex infarct with thinning. Coronary calcification. Stable cardiomegaly. Hepatobiliary: No convincing masslike finding.No evidence of biliary obstruction or stone. Pancreas: Generalized atrophy. Unilocular appearing cystic density in the pancreatic tail measuring 14 mm. No change from prior in retrospect. Given stability and history of dementia follow-up is likely not indicated. Spleen: Unremarkable. Adrenals/Urinary Tract: Negative adrenals. No hydronephrosis or stone. Thick walled bladder with diverticula/cellules. Bladder wall is mildly indistinct the level of the perivesicular fat. Stomach/Bowel: No obstruction. No appendicitis. Numerous left colonic diverticula. Vascular/Lymphatic: No acute vascular abnormality. Diffuse atherosclerotic calcification of the aorta. No mass or adenopathy. Reproductive:Enlarged prostate up lifting the bladder based and showing nodularity that is similar to prior. Other:  No ascites or pneumoperitoneum.  Fatty left inguinal hernia. Musculoskeletal: Advanced spinal degeneration with mild dextroscoliosis. No acute osseous finding. Remote right rib fractures posteriorly. IMPRESSION: 1. Cystitis appearance. There are background findings of chronic outlet obstruction with bladder diverticula and cellules. The prostate is chronically enlarged and nodular. 2. Sigmoid diverticulosis. 3. Aortic Atherosclerosis (ICD10-I70.0). There is also coronary atherosclerosis and remote left ventricular apex infarct. 4. 14 mm pancreatic cystic lesion that remain stable from CT 1 year ago. Electronically Signed   By: Marnee Spring M.D.   On: 03/27/2019 11:37   DG Chest Portable 1 View  Result Date: 03/08/2019 CLINICAL DATA:  Fever and weakness EXAM: PORTABLE CHEST 1 VIEW COMPARISON:  03/01/2018 FINDINGS: Low lung  volumes reflecting poor inspiration. Patchy left greater than right perihilar density. No significant pleural effusion or pneumothorax. Cardiomediastinal contours are similar with probable cardiomegaly. IMPRESSION: Patchy left greater than right perihilar density, which could reflect edema or pneumonia in the appropriate setting. Electronically Signed   By: Guadlupe Spanish M.D.   On: 03/15/2019 10:44      EKG: Independently reviewed.  Rate 95, prolonged QTC 537   Assessment/Plan Principal Problem:   Sepsis (HCC) secondary to acute UTI, pneumonia/aspiration -Patient presented with sepsis with fever, hypotension, acute kidney injury, lactic acidosis, leukocytosis, UA positive for UTI -Leukocytosis 27.0, obtain blood cultures -Continue IV Rocephin -Given advanced dementia, severe sepsis, will place palliative consult for goals of care.  Active Problems: Community-acquired pneumonia versus aspiration -Chest x-ray showed patchy left greater than right perihilar density which could reflect edema or pneumonia in appropriate setting -Placed on IV Rocephin, will add Zithromax -SLP evaluation.    Chronic dementia (HCC) -Currently appears to be at baseline, pleasant however not a good historian -Continue Seroquel, Aricept    AKI (acute kidney injury) (HCC) with Lactic acidosis -Creatinine 1.5, baseline creatinine 1.07 in 2019 -Continue IV fluid hydration  Prolonged QTC -Keep K >4, Mag >2  - Avoid QTC prolonging meds  History of DVT -Pharmacy verified, patient is on apixaban due to history of DVT in 05/2017, will continue.  DVT prophylaxis: Apixaban  CODE STATUS: DNR, verified in the nursing home paperwork  Consults called: None  Admission status: BP soft, admit to stepdown unit.  Family communication: I called patient's son on phone #[phone] however was unable to reach him.  Will place palliative medicine for goals of care given severe sepsis.  The medical decision making on  this patient was of high complexity and the patient is at high risk for clinical deterioration, therefore this is a level 3 admission.  Severity of Illness:     The appropriate patient status for this patient is INPATIENT. Inpatient status is judged to be reasonable and necessary in order to provide the required intensity of service to ensure the patient's safety. The patient's presenting symptoms, physical exam findings, and initial radiographic and laboratory data in the context of their chronic comorbidities is felt to place them at high risk for further clinical deterioration. Furthermore, it is not anticipated that the patient will be medically stable for discharge from the hospital within 2 midnights of admission. The following factors support the patient status of inpatient.   " The patient's presenting symptoms include fevers, sepsis, UTI, pneumonia " The worrisome physical exam findings include sepsis, UTI, pneumonia " The initial radiographic and laboratory data are worrisome because of UA positive for UTI, chest x-ray with pneumonia " The chronic co-morbidities include dementia   * I certify that at the  point of admission it is my clinical judgment that the patient will require inpatient hospital care spanning beyond 2 midnights from the point of admission due to high intensity of service, high risk for further deterioration and high frequency of surveillance required.*    Time Spent on Admission: 46mins      Timothy Townsel M.D. [facility] 03/27/2019, 2:37 PM

## 2019-03-11 NOTE — Progress Notes (Addendum)
ANTICOAGULATION CONSULT NOTE - Initial Consult  Pharmacy Consult for Apixaban Indication: history of DVT  No Known Allergies  Patient Measurements: Height: 5\' 6"  (167.6 cm) Weight: 175 lb (79.4 kg) IBW/kg (Calculated) : 63.8  Vital Signs: Temp: 100.5 F (38.1 C) (01/04 1628) Temp Source: Rectal (01/04 1628) BP: 90/56 (01/04 1630) Pulse Rate: 79 (01/04 1630)  Labs: Recent Labs    2019-04-05 0949  HGB 11.9*  HCT 37.4*  PLT 201  LABPROT 17.6*  INR 1.5*  CREATININE 1.50*    Estimated Creatinine Clearance: 31.1 mL/min (A) (by C-G formula based on SCr of 1.5 mg/dL (H)).   Medical History: Past Medical History:  Diagnosis Date  . Dementia (HCC)   . PVD (peripheral vascular disease) (HCC)   . Unsteadiness on feet   . Urinary incontinence   . Vitamin D deficiency      Assessment: 25 y/oM with PMH of DVT on Apixaban PTA who presents to [facility] ED on 04/05/19 from SNF for fever, low BP, and UTI. Per notes from last admission, patient has been on Apixaban since 05/25/2017. Facility confirmed this today and stated no end date listed at this time. Dr. 05/27/2017 made aware of findings and MD has elected to resume Apixaban at this time. Unclear if patient has had only one occurrence of DVT or more; facility unable to provide this information, but states patient has not had any since arrival to their facility in March 2019. Last dose of Apixaban 5mg  PO BID at facility 03/10/2019 at 2000. CBC: Hgb 11.9, Pltc WNL.   Plan:   Per discussion with Dr. , continue on Apixaban 5mg  PO BID at this time, as patient was on prior to admission.  Monitor CBC and for s/sx of bleeding.   05/08/2019 2019-04-05,4:41 PM

## 2019-03-11 NOTE — ED Notes (Addendum)
Pt responds when this writer is speaking with him; denies pain. Resting without signs of distress. Even after infusion of fluids, pt blood pressure remains low. Provider is aware. Pt remains on 2 lpm  as placed on arrival.

## 2019-03-11 NOTE — Progress Notes (Addendum)
Asked by attending to f/up on pt for hypotension. NP reviewed chart. Even though is has soft BPs (88-92), his MAP remains 65 or over.  NP to bedside S: Pt is not a good historian due to his dementia.  O: Chronically ill appearing elderly male in NAD. BP 87/48. HR 60s. RR 18. Sao2 98% on 2L [place]. He opens his eyes to voice. He says "thank you" to every question. Card: RRR. Occasional PVC. Resp: crackles at bases, otherwise CTA. No wheezing. No increased WOB or respiratory distress. Abd: BS x 4 quadrants. Grimaces with palpation to the lower quadrants. MOE x 4. Skin is pale, but warm to touch. Extremities are warm.  A/P:  1. Hypotension secondary to urosepsis-on abx. LA was elevated earlier. Per RN, pt has had 3.5 L of NS. Not on maintenance fluids. MAP is 65 or over. He has urinated incontinently into diaper. LR 1000cc bolus ordered now. IVF at 100cc/hr after that. NP doesn't feel he needs pressors at this point. Plus, NP would like to talk with son about this before starting. Will touch base with PCCM, but NP sure they are not going to want to start pressors before he receives more IVF and probably not at all given his baseline dementia, QOL issues, and living in a SNF. Plus, he is a DNR (confirmed by son today per attending note). Recheck LA and CBC. Continue present treatment. It is encouraging that pt awakens easily, is warm to touch and has voided.  2. Dementia with acute illness-NPO for now. Per day RN, pt's mental status remains the same as when he arrived. Hold sedative meds.  3. AKI-baseline creat 1.07, today 1.50. Pt has had fluids, so recheck BMP.  4. Abdominal tenderness-UTI and cystitis noted on CT earlier. No vomiting.  5. Sepsis-see #1.  6. Occasional PVCs. Check Mg. Keep Mg over 2. K is over 4.  Extended care necessary for reassessment of pt, update treatment of pt and tx plan.  Start : 68. End 1940. Total 35 mins.  KJKG, NP [facility] Update: checked on pt at bedside. Resting. No distress.  Got angry when NP tried to put O2 back in nares, but quickly calmed. BP 107/75. LR bolus infusing.  KJKG, NP [facility] Update: MAP remains at least 65 except for one outlying BP. K was 5.5. repeating that now. LA down to 2. Repeating that now. Sign out left for oncoming attending.  KJKG, NP [facility]

## 2019-03-11 NOTE — ED Notes (Addendum)
Lactic 3.9, [place] notified.

## 2019-03-11 NOTE — ED Notes (Signed)
Report states n/v at facility; pt no emesis since arrived to hospital.

## 2019-03-11 NOTE — ED Provider Notes (Signed)
[facility]-EMERGENCY DEPT Provider Note   CSN: [id number] Arrival date & time: 03/12/19  3299     History Chief Complaint  Patient presents with  . Emesis  . Urinary Tract Infection    Paul Cox is a 86 y.o. male.  Pt presents to the ED today with a possible UTI.  The pt has severe dementia and is unable to give any hx.  The pt has had some vomiting with strong smelling urine.  He has had a low grade fever and low bp.  Pt was 90% on RA and was placed on 2L by EMS.  O2 sats now in the upper 90s.  EMS also gave 350 cc NS en route.  No known covid contacts, but he is from a SNF.        Past Medical History:  Diagnosis Date  . Dementia (HCC)   . PVD (peripheral vascular disease) (HCC)   . Unsteadiness on feet   . Urinary incontinence   . Vitamin D deficiency     Patient Active Problem List   Diagnosis Date Noted  . Acute diarrhea 03/04/2018  . Intertrigo 03/02/2018  . Chronic dementia (HCC) 03/02/2018  . Diverticulitis 03/01/2018    History reviewed. No pertinent surgical history.     No family history on file.  Social History   Tobacco Use  . Smoking status: Never Smoker  . Smokeless tobacco: Never Used  Substance Use Topics  . Alcohol use: Not Currently  . Drug use: Not Currently    Home Medications Prior to Admission medications   Medication Sig Start Date End Date Taking? Authorizing Provider  acetaminophen (TYLENOL) 500 MG tablet Take 1,000 mg by mouth every 8 (eight) hours as needed (for pain).    Yes [provider]  apixaban (ELIQUIS) 5 MG TABS tablet Take 5 mg by mouth 2 (two) times daily.   Yes [provider]  Cholecalciferol (VITAMIN D3) 25 MCG (1000 UT) CAPS Take 1,000 Units by mouth daily.   Yes [provider]  donepezil (ARICEPT) 10 MG tablet Take 10 mg by mouth at bedtime.   Yes [provider]  loperamide (IMODIUM A-D) 2 MG tablet Take 2 mg by mouth every 6 (six) hours as  needed for diarrhea or loose stools.   Yes [provider]  Multiple Vitamins-Minerals (ONE-A-DAY PROACTIVE 65+) TABS Take 1 tablet by mouth daily.   Yes [provider]  QUEtiapine (SEROQUEL) 25 MG tablet Take 25 mg by mouth 2 (two) times daily.   Yes [provider]  QUEtiapine (SEROQUEL) 25 MG tablet Take 25 mg by mouth 2 (two) times daily.    Yes [provider]  Skin Protectants, Misc. (BAZA PROTECT EX) Apply 1 application topically 3 (three) times daily. Apply to bottom every shift for redness   Yes [provider]  traZODone (DESYREL) 25 mg TABS tablet Take 12.5 mg by mouth every 12 (twelve) hours as needed (for agitation).   Yes [provider]  vitamin B-12 (CYANOCOBALAMIN) 1000 MCG tablet Take 1,000 mcg by mouth daily.   Yes [provider]  zolpidem (AMBIEN) 5 MG tablet Take 2.5 mg by mouth at bedtime.   Yes [provider]  senna-docusate (SENOKOT-S) 8.6-50 MG tablet Take 1 tablet by mouth at bedtime as needed for mild constipation. Patient not taking: Reported on 2019/03/12 03/05/18   Synetta Shadow, MD    Allergies    Patient has no known allergies.  Review of  Systems   Review of Systems  Unable to perform ROS: Dementia    Physical Exam Updated Vital Signs BP (!) 144/129 Comment: aggitated  Pulse 81   Temp (!) 101.1 F (38.4 C) (Rectal)   Resp (!) 22   Ht 5\' 6"  (1.676 m)   Wt 79.4 kg   SpO2 97%   BMI 28.25 kg/m   Physical Exam Vitals and nursing note reviewed.  Constitutional:      General: He is in acute distress.     Appearance: He is ill-appearing.  HENT:     Head: Normocephalic and atraumatic.     Right Ear: External ear normal.     Left Ear: External ear normal.     Nose: Nose normal.     Mouth/Throat:     Mouth: Mucous membranes are dry.  Eyes:     Extraocular Movements: Extraocular movements intact.     Conjunctiva/sclera: Conjunctivae normal.     Pupils: Pupils are equal,  round, and reactive to light.  Cardiovascular:     Rate and Rhythm: Regular rhythm. Tachycardia present.     Pulses: Normal pulses.     Heart sounds: Normal heart sounds.  Pulmonary:     Effort: Pulmonary effort is normal. Tachypnea present.     Breath sounds: Normal breath sounds.  Abdominal:     General: Abdomen is flat. Bowel sounds are decreased.     Tenderness: There is generalized abdominal tenderness.  Musculoskeletal:        General: Normal range of motion.     Cervical back: Normal range of motion and neck supple.  Skin:    General: Skin is warm.     Capillary Refill: Capillary refill takes 2 to 3 seconds.  Neurological:     Mental Status: He is alert. Mental status is at baseline. He is disoriented.  Psychiatric:     Comments: Unable to assess     ED Results / Procedures / Treatments   Labs (all labs ordered are listed, but only abnormal results are displayed) Labs Reviewed  COMPREHENSIVE METABOLIC PANEL - Abnormal; Notable for the following components:      Result Value   CO2 21 (*)    Glucose, Bld 119 (*)    BUN 27 (*)    Creatinine, Ser 1.50 (*)    Albumin 3.4 (*)    GFR calc non Af Amer 40 (*)    GFR calc Af Amer 46 (*)    All other components within normal limits  LACTIC ACID, PLASMA - Abnormal; Notable for the following components:   Lactic Acid, Venous 3.9 (*)    All other components within normal limits  LACTIC ACID, PLASMA - Abnormal; Notable for the following components:   Lactic Acid, Venous 3.4 (*)    All other components within normal limits  CBC WITH DIFFERENTIAL/PLATELET - Abnormal; Notable for the following components:   WBC 27.0 (*)    RBC 3.87 (*)    Hemoglobin 11.9 (*)    HCT 37.4 (*)    Neutro Abs 24.8 (*)    Lymphs Abs 0.5 (*)    Monocytes Absolute 1.5 (*)    Abs Immature Granulocytes 0.29 (*)    All other components within normal limits  PROTIME-INR - Abnormal; Notable for the following components:   Prothrombin Time 17.6 (*)     INR 1.5 (*)    All other components within normal limits  URINALYSIS, ROUTINE W REFLEX MICROSCOPIC - Abnormal; Notable for the following components:  Color, Urine AMBER (*)    APPearance CLOUDY (*)    Specific Gravity, Urine 1.035 (*)    Hgb urine dipstick SMALL (*)    Ketones, ur 5 (*)    Protein, ur 30 (*)    Leukocytes,Ua MODERATE (*)    WBC, UA >50 (*)    Bacteria, UA RARE (*)    Non Squamous Epithelial 0-5 (*)    All other components within normal limits  RESPIRATORY PANEL BY RT PCR (FLU A&B, COVID)  CULTURE, BLOOD (ROUTINE X 2)  CULTURE, BLOOD (ROUTINE X 2)  URINE CULTURE  POC SARS CORONAVIRUS 2 AG -  ED    EKG EKG Interpretation  Date/Time:  Monday Mar 27, 2019 09:48:54 EST Ventricular Rate:  95 PR Interval:    QRS Duration: 76 QT Interval:  427 QTC Calculation: 537 R Axis:   20 Text Interpretation: Sinus rhythm Ventricular premature complex Aberrant complex Anterolateral infarct, age indeterminate Prolonged QT interval Confirmed by Jacalyn Lefevre [phone]) on 2019/03/27 9:56:36 AM   Radiology CT ABDOMEN PELVIS W CONTRAST  Result Date: 2019-03-27 CLINICAL DATA:  Hip Dom pain with diverticulitis suspected. Question UTI with emesis. Strong smelling urine EXAM: CT ABDOMEN AND PELVIS WITH CONTRAST TECHNIQUE: Multidetector CT imaging of the abdomen and pelvis was performed using the standard protocol following bolus administration of intravenous contrast. CONTRAST:  78mL OMNIPAQUE IOHEXOL 300 MG/ML  SOLN COMPARISON:  03/01/2018 FINDINGS: Lower chest: Remote left ventricular apex infarct with thinning. Coronary calcification. Stable cardiomegaly. Hepatobiliary: No convincing masslike finding.No evidence of biliary obstruction or stone. Pancreas: Generalized atrophy. Unilocular appearing cystic density in the pancreatic tail measuring 14 mm. No change from prior in retrospect. Given stability and history of dementia follow-up is likely not indicated. Spleen: Unremarkable.  Adrenals/Urinary Tract: Negative adrenals. No hydronephrosis or stone. Thick walled bladder with diverticula/cellules. Bladder wall is mildly indistinct the level of the perivesicular fat. Stomach/Bowel: No obstruction. No appendicitis. Numerous left colonic diverticula. Vascular/Lymphatic: No acute vascular abnormality. Diffuse atherosclerotic calcification of the aorta. No mass or adenopathy. Reproductive:Enlarged prostate up lifting the bladder based and showing nodularity that is similar to prior. Other: No ascites or pneumoperitoneum.  Fatty left inguinal hernia. Musculoskeletal: Advanced spinal degeneration with mild dextroscoliosis. No acute osseous finding. Remote right rib fractures posteriorly. IMPRESSION: 1. Cystitis appearance. There are background findings of chronic outlet obstruction with bladder diverticula and cellules. The prostate is chronically enlarged and nodular. 2. Sigmoid diverticulosis. 3. Aortic Atherosclerosis (ICD10-I70.0). There is also coronary atherosclerosis and remote left ventricular apex infarct. 4. 14 mm pancreatic cystic lesion that remain stable from CT 1 year ago. Electronically Signed   By: Marnee Spring M.D.   On: 2019-03-27 11:37   DG Chest Portable 1 View  Result Date: Mar 27, 2019 CLINICAL DATA:  Fever and weakness EXAM: PORTABLE CHEST 1 VIEW COMPARISON:  03/01/2018 FINDINGS: Low lung volumes reflecting poor inspiration. Patchy left greater than right perihilar density. No significant pleural effusion or pneumothorax. Cardiomediastinal contours are similar with probable cardiomegaly. IMPRESSION: Patchy left greater than right perihilar density, which could reflect edema or pneumonia in the appropriate setting. Electronically Signed   By: Guadlupe Spanish M.D.   On: Mar 27, 2019 10:44    Procedures Procedures (including critical care time)  Medications Ordered in ED Medications  cefTRIAXone (ROCEPHIN) 1 g in sodium chloride 0.9 % 100 mL IVPB (0 g Intravenous Stopped  03-27-19 1029)  acetaminophen (TYLENOL) suppository 975 mg (975 mg Rectal Not Given 2019/03/27 1000)  acetaminophen (TYLENOL) 325 MG suppository (  Not Given 2019-04-03 1001)  sodium chloride (PF) 0.9 % injection (has no administration in time range)  sodium chloride 0.9 % bolus 1,000 mL (0 mLs Intravenous Stopped 03-Apr-2019 1058)    And  sodium chloride 0.9 % bolus 1,000 mL (1,000 mLs Intravenous New Bag/Given 04/03/2019 1210)    And  sodium chloride 0.9 % bolus 500 mL (0 mLs Intravenous Stopped 04-03-2019 1031)  ondansetron (ZOFRAN) injection 4 mg (4 mg Intravenous Given 2019/04/03 1010)  acetaminophen (TYLENOL) 650 MG suppository (975 mg  Given 2019/04/03 1000)  iohexol (OMNIPAQUE) 300 MG/ML solution 100 mL (80 mLs Intravenous Contrast Given Apr 03, 2019 1114)  aspirin suppository 300 mg (300 mg Rectal Given 03-Apr-2019 1311)    ED Course  I have reviewed the triage vital signs and the nursing notes.  Pertinent labs & imaging results that were available during my care of the patient were reviewed by me and considered in my medical decision making (see chart for details).    MDM Rules/Calculators/A&P                     Pt given sepsis fluids and bp has improved.  The pt is febrile, so was given tylenol and asa supp.  Pt given IV rocephin for UTI.    Covid negative.  Pt looks better after the fluids.  Pt d/w Dr. Tana Coast ([facility]) for admission.  Keavon Sensing Stevens was evaluated in Emergency Department on 2019/04/03 for the symptoms described in the history of present illness. He was evaluated in the context of the global COVID-19 pandemic, which necessitated consideration that the patient might be at risk for infection with the SARS-CoV-2 virus that causes COVID-19. Institutional protocols and algorithms that pertain to the evaluation of patients at risk for COVID-19 are in a state of rapid change based on information released by regulatory bodies including the CDC and federal and state organizations. These policies and algorithms  were followed during the patient's care in the ED.  CRITICAL CARE Performed by: Isla Pence   Total critical care time: 60 minutes  Critical care time was exclusive of separately billable procedures and treating other patients.  Critical care was necessary to treat or prevent imminent or life-threatening deterioration.  Critical care was time spent personally by me on the following activities: development of treatment plan with patient and/or surrogate as well as nursing, discussions with consultants, evaluation of patient's response to treatment, examination of patient, obtaining history from patient or surrogate, ordering and performing treatments and interventions, ordering and review of laboratory studies, ordering and review of radiographic studies, pulse oximetry and re-evaluation of patient's condition. Final Clinical Impression(s) / ED Diagnoses Final diagnoses:  Sepsis without acute organ dysfunction, due to unspecified organism [facility])  Acute cystitis without hematuria    Rx / DC Orders ED Discharge Orders    None       Isla Pence, MD 2019-04-03 1401

## 2019-03-11 NOTE — ED Triage Notes (Signed)
Ems reports pt from [place] at [facility]. Pt with ? UTI, emesis reported. Urine strong smelling. 100/60-104-90 % RA, CBG 148 Temp 99.1 IV # 20 L Hand, 350 bolus given

## 2019-03-11 NOTE — ED Notes (Signed)
Condom cath has been placed. 

## 2019-03-11 NOTE — ED Notes (Signed)
Pt placed on 2 lpm [place] by [employer].

## 2019-03-11 NOTE — ED Notes (Addendum)
Spoke with Rai MD regarding pt blood pressure in the systolic 80s and ongoing fever. Verbal orders given (1000 ml bolus NS and 975 mg of tylenol rectal). Rai MD also verbalizes do not give any oral medications with pt AMS and chief complaint of emesis.

## 2019-03-11 NOTE — ED Notes (Signed)
Pt pulled of condom catheter. Gown and bed linen changed. Pt repositioned. NAD. Pt resting.

## 2019-03-11 NOTE — ED Notes (Signed)
Paul Cox in main lab verbalizes can add on cortisol from "saved gold tube" in lab; verbalizes will process lab.

## 2019-03-11 NOTE — ED Notes (Signed)
Provider at bedside assessing pt

## 2019-03-11 NOTE — ED Notes (Signed)
Recollect light green completed and sent to lab.

## 2019-03-11 NOTE — TOC Initial Note (Signed)
Transition of Care [facility]) - Initial/Assessment Note    Patient Details  Name: Paul Cox MRN: [id number] Date of Birth: 1924/05/14  Transition of Care [facility]) CM/SW Contact:    Elliot Cousin, RN Phone Number:  [phone] 03/19/2019, 6:14 PM  Clinical Narrative:                 Pt is from [facility] ALF/Memory Care, and will return once stable.  Spoke to pt's son, Deverick [phone], explained attending MD, Dr Isidoro Donning contacted him to go over pt's dx and tx. States he was not available but will pick up when MD calls back. Also Palliative Consult was placed and he will receive a call 03/12/2019. Verbalized understanding.    Expected Discharge Plan: Memory Care Barriers to Discharge: Continued Medical Work up   Patient Goals and CMS Choice Patient states their goals for this hospitalization and ongoing recovery are:: return to [facility] ALF/Memory Care CMS Medicare.gov Compare Post Acute Care list provided to:: Patient Represenative (must comment)(son, Dennard Nip) Choice offered to / list presented to : Adult Children  Expected Discharge Plan and Services Expected Discharge Plan: Memory Care In-house Referral: Clinical Social Work Discharge Planning Services: CM Consult   Living arrangements for the past 2 months: Assisted Living Facility                                      Prior Living Arrangements/Services Living arrangements for the past 2 months: Assisted Living Facility Lives with:: Facility Resident                   Activities of Daily Living Home Assistive Devices/Equipment: Blood pressure cuff, Hospital bed, Grab bars around toilet, Grab bars in shower, Hand-held shower hose, Wheelchair, Scales([facility] has necessary equipment for their residents) ADL Screening (condition at time of admission) Patient's cognitive ability adequate to safely complete daily activities?: No Is the patient deaf or have difficulty hearing?: Yes(very  hoh) Does the patient have difficulty seeing, even when wearing glasses/contacts?: No Does the patient have difficulty concentrating, remembering, or making decisions?: Yes Patient able to express need for assistance with ADLs?: No Does the patient have difficulty dressing or bathing?: Yes Independently performs ADLs?: No Communication: Independent Dressing (OT): Needs assistance Is this a change from baseline?: Pre-admission baseline Grooming: Needs assistance Is this a change from baseline?: Pre-admission baseline Feeding: Needs assistance Is this a change from baseline?: Pre-admission baseline Bathing: Needs assistance Is this a change from baseline?: Pre-admission baseline Toileting: Needs assistance Is this a change from baseline?: Pre-admission baseline In/Out Bed: Needs assistance Is this a change from baseline?: Pre-admission baseline Walks in Home: Needs assistance Is this a change from baseline?: Pre-admission baseline Does the patient have difficulty walking or climbing stairs?: Yes(secondary to weakness) Weakness of Legs: Both Weakness of Arms/Hands: None  Permission Sought/Granted   Permission granted to share information with : Yes, Verbal Permission Granted  Share Information with NAME: Cameo Shewell  Permission granted to share info w AGENCY: [facility]  Permission granted to share info w Relationship: son  Permission granted to share info w Contact Information: [phone]  Emotional Assessment           Psych Involvement: No (comment)  Admission diagnosis:  Sepsis [facility]) [A41.9] Patient Active Problem List   Diagnosis Date Noted  . Sepsis (HCC) 03/18/2019  . Acute lower UTI 03/30/2019  . AKI (acute kidney  injury) ([place]) March 19, 2019  . Lactic acidosis 2019/03/19  . Acute diarrhea 03/04/2018  . Intertrigo 03/02/2018  . Chronic dementia ([place]) 03/02/2018  . Diverticulitis 03/01/2018   PCP:  Housecalls, [facility] Pharmacy:  No Pharmacies  Listed    Social Determinants of Health (SDOH) Interventions    Readmission Risk Interventions No flowsheet data found.

## 2019-03-11 NOTE — Plan of Care (Signed)
6:45 pm   Patient admitted with severe sepsis on 3rd L IV fluids, now persistently hypotensive  - Will broaden antibiotic coverage. DC IV rocephin, add Vancomycin and cefepime - obtain stat cortisol level, if low, can start IV solucortef   - Palliative medicine also consulted for GOC - hold po meds until alert  - talked to patient's son in detail and poor prognosis with severe sepsis, septic shock, advanced age and dementia. He requested continue DNR/DNI status but vasopressors if possible. If no improvement in next 24-48hrs, would pursue comfort care.  - Will discuss with CCM. Signed out to floor coverage.    Thad Ranger M.D. [facility] 03/19/2019, 6:58 PM  Pager: [phone]

## 2019-03-12 ENCOUNTER — Other Ambulatory Visit: Payer: Self-pay

## 2019-03-12 DIAGNOSIS — A419 Sepsis, unspecified organism: Secondary | ICD-10-CM | POA: Diagnosis present

## 2019-03-12 LAB — CBC
HCT: 32.6 % — ABNORMAL LOW (ref 39.0–52.0)
Hemoglobin: 10.4 g/dL — ABNORMAL LOW (ref 13.0–17.0)
MCH: 31.3 pg (ref 26.0–34.0)
MCHC: 31.9 g/dL (ref 30.0–36.0)
MCV: 98.2 fL (ref 80.0–100.0)
Platelets: 151 10*3/uL (ref 150–400)
RBC: 3.32 MIL/uL — ABNORMAL LOW (ref 4.22–5.81)
RDW: 13.9 % (ref 11.5–15.5)
WBC: 20.2 10*3/uL — ABNORMAL HIGH (ref 4.0–10.5)
nRBC: 0 % (ref 0.0–0.2)

## 2019-03-12 LAB — BASIC METABOLIC PANEL
Anion gap: 7 (ref 5–15)
BUN: 29 mg/dL — ABNORMAL HIGH (ref 8–23)
CO2: 20 mmol/L — ABNORMAL LOW (ref 22–32)
Calcium: 7.7 mg/dL — ABNORMAL LOW (ref 8.9–10.3)
Chloride: 112 mmol/L — ABNORMAL HIGH (ref 98–111)
Creatinine, Ser: 1.37 mg/dL — ABNORMAL HIGH (ref 0.61–1.24)
GFR calc Af Amer: 52 mL/min — ABNORMAL LOW (ref >60)
GFR calc non Af Amer: 44 mL/min — ABNORMAL LOW (ref >60)
Glucose, Bld: 115 mg/dL — ABNORMAL HIGH (ref 70–99)
Potassium: 5.5 mmol/L — ABNORMAL HIGH (ref 3.5–5.1)
Sodium: 139 mmol/L (ref 135–145)

## 2019-03-12 LAB — URINE CULTURE

## 2019-03-12 LAB — LACTIC ACID, PLASMA
Lactic Acid, Venous: 1.8 mmol/L (ref 0.5–1.9)
Lactic Acid, Venous: 2 mmol/L (ref 0.5–1.9)

## 2019-03-12 LAB — POTASSIUM: Potassium: 4.2 mmol/L (ref 3.5–5.1)

## 2019-03-12 LAB — MRSA PCR SCREENING: MRSA by PCR: NEGATIVE

## 2019-03-12 MED ORDER — LACTATED RINGERS IV BOLUS
500.0000 mL | Freq: Once | INTRAVENOUS | Status: AC
  Administered 2019-03-12: 500 mL via INTRAVENOUS

## 2019-03-12 NOTE — ED Notes (Signed)
Pt had bm, brief was changed and pt cleaned, pt was repositioned in bed, rectal temp taken, pt given covers, oxygen again replaced on pt.

## 2019-03-12 NOTE — Progress Notes (Signed)
SLP Cancellation Note  Patient Details Name: GURVIR SCHROM MRN: [id number] DOB: 03-31-1924   Cancelled treatment:        Attempted to call ED, no answer at 1540, will continue efforts.   Rolena Infante, MS [facility] SLP Acute Rehab Services Office [phone]  Chales Abrahams 03/12/2019, 3:41 PM

## 2019-03-12 NOTE — Progress Notes (Addendum)
PROGRESS NOTE    [facility]  GYI:[id number] DOB: 04/19/1924 DOA: 03/08/2019 PCP: Orvis Brill, [facility]    Brief Narrative:  86 year old male with history of advanced Alzheimer's dementia, PVD, frequent UTIs, vitamin D deficiency was sent from skilled nursing facility for low BP, fevers and UTI.  Patient has severe dementia and unable to give any history.  Per EMS, nursing home report, patient had some vomiting and was noticed to have strong smelling urine.  He was noticed to have low-grade temp, O2 sats 90% on room air and was placed on 2 L.  Assessment & Plan:   Principal Problem:   Sepsis ([place]) Active Problems:   Chronic dementia ([place])   Acute lower UTI   AKI (acute kidney injury) ([place])   Lactic acidosis   Sepsis due to pneumonia [facility])  Principal Problem:   Sepsis ([place]) secondary to acute UTI, pneumonia/aspiration -Patient presented with sepsis with fever, hypotension, acute kidney injury, lactic acidosis, leukocytosis, UA positive for UTI -Leukocytosis 27.0 -Continue IV cefepime, vanc and azithro -Given advanced dementia, severe sepsis, palliative care was consulted for [place] -This AM, WBC down to 20k. BP stable. RR improved -Lactate normalized from 3.4 to 1.8  Active Problems: Community-acquired pneumonia versus aspiration -Chest x-ray showed patchy left greater than right perihilar density which could reflect edema or pneumonia in appropriate setting -cont on vanc, cefepime and azithro -SLP consulted    Chronic dementia ([place]) -Currently appears to be at baseline, pleasant however not a good historian -Continue Seroquel, Aricept as tolerated    AKI (acute kidney injury) ([place]) with Lactic acidosis -Creatinine 1.5, baseline creatinine 1.07 in 2019 -Continue IV fluid hydration as tolerated -Cr improved to 1.37  Prolonged QTC -Keep K >4, Mag >2  - Avoid QTC prolonging meds  History of DVT -Pharmacy verified, patient is on apixaban due to history of  DVT in 05/2017 -will continue.  DVT prophylaxis: Xarelto Code Status: DNR Family Communication: Pt in room, family not at bedside Disposition Plan: Uncertain   Consultants:   Palliative Care  Procedures:     Antimicrobials: Anti-infectives (From admission, onward)   Start     Dose/Rate Route Frequency Ordered Stop   03/13/19 1000  vancomycin (VANCOREADY) IVPB 1250 mg/250 mL     1,250 mg 166.7 mL/hr over 90 Minutes Intravenous Every 36 hours 03/23/2019 1858     03/12/19 0800  ceFEPIme (MAXIPIME) 2 g in sodium chloride 0.9 % 100 mL IVPB     2 g 200 mL/hr over 30 Minutes Intravenous Every 12 hours 03/29/2019 1852     03/18/2019 1900  ceFEPIme (MAXIPIME) 2 g in sodium chloride 0.9 % 100 mL IVPB     2 g 200 mL/hr over 30 Minutes Intravenous STAT 03/22/2019 1852 04/07/2019 2036   04/02/2019 1900  vancomycin (VANCOREADY) IVPB 1750 mg/350 mL     1,750 mg 175 mL/hr over 120 Minutes Intravenous STAT 03/24/2019 1856 03/12/19 0119   04/04/2019 1630  azithromycin (ZITHROMAX) 500 mg in sodium chloride 0.9 % 250 mL IVPB     500 mg 250 mL/hr over 60 Minutes Intravenous Every 24 hours 03/19/2019 1619     03/31/2019 1000  cefTRIAXone (ROCEPHIN) 1 g in sodium chloride 0.9 % 100 mL IVPB  Status:  Discontinued     1 g 200 mL/hr over 30 Minutes Intravenous Every 24 hours 04/03/2019 0925 03/09/2019 1843       Subjective: Confused, keeps repeating "Thank you"  Objective: Vitals:   03/12/19 1200 03/12/19 1330 03/12/19 1456  03/12/19 1626  BP: 112/68 110/67 120/77 125/79  Pulse: 69  84 80  Resp: 13 (!) 25 18 20   Temp:    98.3 F (36.8 C)  TempSrc:    Oral  SpO2: 97%  93% 95%  Weight:      Height:        Intake/Output Summary (Last 24 hours) at 03/12/2019 1806 Last data filed at 03/12/2019 1500 Gross per 24 hour  Intake 2387.5 ml  Output --  Net 2387.5 ml   Filed Weights   03/25/2019 0954  Weight: 79.4 kg    Examination:  General exam: Appears calm and comfortable  Respiratory system: Clear to  auscultation. Respiratory effort normal. Cardiovascular system: S1 & S2 heard, Regular Gastrointestinal system: Abdomen is nondistended, soft and nontender. No organomegaly or masses felt. Normal bowel sounds heard. Central nervous system: alert but confused. No focal neurological deficits. Extremities: Symmetric 5 x 5 power. Skin: No rashes, lesions Psychiatry: difficult to assess given mentation   Data Reviewed: I have personally reviewed following labs and imaging studies  CBC: Recent Labs  Lab 03/29/2019 0949 03/18/2019 2108 03/12/19 0336  WBC 27.0* 22.0* 20.2*  NEUTROABS 24.8*  --   --   HGB 11.9* 10.0* 10.4*  HCT 37.4* 30.8* 32.6*  MCV 96.6 96.6 98.2  PLT 201 227 151   Basic Metabolic Panel: Recent Labs  Lab 03/27/2019 0949 03/22/2019 2108 04/07/2019 2353 03/12/19 0821  NA 141  --  139  --   K 4.2  --  5.5* 4.2  CL 108  --  112*  --   CO2 21*  --  20*  --   GLUCOSE 119*  --  115*  --   BUN 27*  --  29*  --   CREATININE 1.50*  --  1.37*  --   CALCIUM 8.9  --  7.7*  --   MG  --  1.9  --   --    GFR: Estimated Creatinine Clearance: 34.1 mL/min (A) (by C-G formula based on SCr of 1.37 mg/dL (H)). Liver Function Tests: Recent Labs  Lab 03/26/2019 0949  AST 32  ALT 17  ALKPHOS 47  BILITOT 0.9  PROT 6.8  ALBUMIN 3.4*   No results for input(s): LIPASE, AMYLASE in the last 168 hours. No results for input(s): AMMONIA in the last 168 hours. Coagulation Profile: Recent Labs  Lab 03/28/2019 0949  INR 1.5*   Cardiac Enzymes: No results for input(s): CKTOTAL, CKMB, CKMBINDEX, TROPONINI in the last 168 hours. BNP (last 3 results) No results for input(s): PROBNP in the last 8760 hours. HbA1C: No results for input(s): HGBA1C in the last 72 hours. CBG: No results for input(s): GLUCAP in the last 168 hours. Lipid Profile: No results for input(s): CHOL, HDL, LDLCALC, TRIG, CHOLHDL, LDLDIRECT in the last 72 hours. Thyroid Function Tests: No results for input(s): TSH,  T4TOTAL, FREET4, T3FREE, THYROIDAB in the last 72 hours. Anemia Panel: No results for input(s): VITAMINB12, FOLATE, FERRITIN, TIBC, IRON, RETICCTPCT in the last 72 hours. Sepsis Labs: Recent Labs  Lab 03/29/2019 0949 03/26/2019 1206 03/19/2019 2108 04/05/2019 2353 03/12/19 0821  PROCALCITON 37.79  --   --   --   --   LATICACIDVEN 3.9* 3.4* 2.6* 2.0* 1.8    Recent Results (from the past 240 hour(s))  Culture, blood (Routine x 2)     Status: None (Preliminary result)   Collection Time: 03/13/2019  9:49 AM   Specimen: BLOOD RIGHT FOREARM  Result Value Ref  Range Status   Specimen Description   Final    BLOOD RIGHT FOREARM Performed at [facility], [address], [place], [place] [address]    Special Requests   Final    BOTTLES DRAWN AEROBIC AND ANAEROBIC Blood Culture adequate volume Performed at [facility], [address] [place], [place] [address]    Culture   Final    NO GROWTH 1 DAY Performed at [facility], [address] [place], [place] [address]    Report Status PENDING  Incomplete  Culture, blood (Routine x 2)     Status: None (Preliminary result)   Collection Time: 03/18/2019  9:49 AM   Specimen: BLOOD  Result Value Ref Range Status   Specimen Description   Final    BLOOD RIGHT ANTECUBITAL Performed at [facility], [address], [place], [place] [address]    Special Requests   Final    BOTTLES DRAWN AEROBIC ONLY Blood Culture adequate volume Performed at [facility], [address] [place], [place] [address]    Culture   Final    NO GROWTH 1 DAY Performed at [facility], [address], [place], [place] [address]    Report Status PENDING  Incomplete  Urine culture     Status: Abnormal   Collection Time: 03/08/2019 12:06 PM   Specimen: In/Out Cath Urine  Result Value Ref Range Status   Specimen Description   Final    IN/OUT CATH URINE Performed at [facility], [address] [place], [place] [address]    Special Requests   Final    NONE Performed at [facility], [address], [place], [place] [address]    Culture MULTIPLE SPECIES PRESENT, SUGGEST RECOLLECTION (A)  Final   Report Status 03/12/2019 FINAL  Final  Respiratory Panel by RT PCR (Flu A&B, Covid) - Nasopharyngeal Swab     Status: None   Collection Time: 03/17/2019 12:06 PM   Specimen: Nasopharyngeal Swab  Result Value Ref Range Status   SARS Coronavirus 2 by RT PCR NEGATIVE NEGATIVE Final    Comment: (NOTE) SARS-CoV-2 target nucleic acids are NOT DETECTED. The SARS-CoV-2 RNA is generally detectable in upper respiratoy specimens during the acute phase of infection. The lowest concentration of SARS-CoV-2 viral copies this assay can detect is 131 copies/mL. A negative result does not preclude SARS-Cov-2 infection and should not be used as the sole basis for treatment or other patient management decisions. A negative result may occur with  improper specimen collection/handling, submission of specimen other than nasopharyngeal swab, presence of viral mutation(s) within the areas targeted by this assay, and inadequate number of viral copies (<131 copies/mL). A negative result must be combined with clinical observations, patient history, and epidemiological information. The expected result is Negative. Fact Sheet for Patients:  https://www.moore.com/ Fact Sheet for Healthcare Providers:  https://www.young.biz/ This test is not yet ap proved or cleared by the [place] FDA and  has been authorized for detection and/or diagnosis of SARS-CoV-2 by FDA under an Emergency Use Authorization (EUA). This EUA will remain  in effect (meaning this test can be used) for the duration of the COVID-19 declaration under Section 564(b)(1) of the Act, 21 U.S.C. section 360bbb-3(b)(1), unless the authorization is terminated  or revoked sooner.    Influenza A by PCR NEGATIVE NEGATIVE Final   Influenza B by PCR NEGATIVE NEGATIVE Final    Comment: (NOTE) The Xpert  Xpress SARS-CoV-2/FLU/RSV assay is intended as an aid in  the diagnosis of influenza from Nasopharyngeal swab specimens and  should not be used as a sole basis for treatment. Nasal washings and  aspirates are unacceptable for Xpert Xpress SARS-CoV-2/FLU/RSV  testing. Fact Sheet for Patients: https://www.moore.com/ Fact Sheet for Healthcare Providers: https://www.young.biz/ This test is not yet approved or cleared by the [place] FDA and  has been authorized for detection and/or diagnosis of SARS-CoV-2 by  FDA under an Emergency Use Authorization (EUA). This EUA will remain  in effect (meaning this test can be used) for the duration of the  Covid-19 declaration under Section 564(b)(1) of the Act, 21  U.S.C. section 360bbb-3(b)(1), unless the authorization is  terminated or revoked. Performed at [facility], [address], [place], [place] [address]   MRSA PCR Screening     Status: None   Collection Time: 03/12/19  3:51 PM   Specimen: Nasal Mucosa; Nasopharyngeal  Result Value Ref Range Status   MRSA by PCR NEGATIVE NEGATIVE Final    Comment:        The GeneXpert MRSA Assay (FDA approved for NASAL specimens only), is one component of a comprehensive MRSA colonization surveillance program. It is not intended to diagnose MRSA infection nor to guide or monitor treatment for MRSA infections. Performed at [facility], [address], [place], [place] [address]      Radiology Studies: CT ABDOMEN PELVIS W CONTRAST  Result Date: 03-23-2019 CLINICAL DATA:  Hip Dom pain with diverticulitis suspected. Question UTI with emesis. Strong smelling urine EXAM: CT ABDOMEN AND PELVIS WITH CONTRAST TECHNIQUE: Multidetector CT imaging of the abdomen and pelvis was performed  using the standard protocol following bolus administration of intravenous contrast. CONTRAST:  72mL OMNIPAQUE IOHEXOL 300 MG/ML  SOLN COMPARISON:  03/01/2018 FINDINGS: Lower chest: Remote left ventricular apex infarct with thinning. Coronary calcification. Stable cardiomegaly. Hepatobiliary: No convincing masslike finding.No evidence of biliary obstruction or stone. Pancreas: Generalized atrophy. Unilocular appearing cystic density in the pancreatic tail measuring 14 mm. No change from prior in retrospect. Given stability and history of dementia follow-up is likely not indicated. Spleen: Unremarkable. Adrenals/Urinary Tract: Negative adrenals. No hydronephrosis or stone. Thick walled bladder with diverticula/cellules. Bladder wall is mildly indistinct the level of the perivesicular fat. Stomach/Bowel: No obstruction. No appendicitis. Numerous left colonic diverticula. Vascular/Lymphatic: No acute vascular abnormality. Diffuse atherosclerotic calcification of the aorta. No mass or adenopathy. Reproductive:Enlarged prostate up lifting the bladder based and showing nodularity that is similar to prior. Other: No ascites or pneumoperitoneum.  Fatty left inguinal hernia. Musculoskeletal: Advanced spinal degeneration with mild dextroscoliosis. No acute osseous finding. Remote right rib fractures posteriorly. IMPRESSION: 1. Cystitis appearance. There are background findings of chronic outlet obstruction with bladder diverticula and cellules. The prostate is chronically enlarged and nodular. 2. Sigmoid diverticulosis. 3. Aortic Atherosclerosis (ICD10-I70.0). There is also coronary atherosclerosis and remote left ventricular apex infarct. 4. 14 mm pancreatic cystic lesion that remain stable from CT 1 year ago. Electronically Signed   By: Marnee Spring M.D.   On: March 23, 2019 11:37   DG Chest Portable 1 View  Result Date: 2019-03-23 CLINICAL DATA:  Fever and weakness EXAM: PORTABLE CHEST 1 VIEW COMPARISON:  03/01/2018  FINDINGS: Low lung volumes reflecting poor inspiration. Patchy left greater than right perihilar density. No significant pleural effusion or pneumothorax. Cardiomediastinal contours are similar with probable cardiomegaly. IMPRESSION: Patchy left greater than right perihilar density, which could reflect edema or pneumonia in the appropriate  setting. Electronically Signed   By: Guadlupe Spanish M.D.   On: 03/23/2019 10:44    Scheduled Meds: . acetaminophen  975 mg Rectal Once  . apixaban  5 mg Oral BID  . cholecalciferol  1,000 Units Oral Daily  . donepezil  10 mg Oral QHS  . vitamin B-12  1,000 mcg Oral Daily   Continuous Infusions: . sodium chloride 100 mL/hr at 03/12/19 1258  . azithromycin 500 mg (03/12/19 1713)  . ceFEPime (MAXIPIME) IV Stopped (03/12/19 0900)  . [START ON 03/13/2019] vancomycin       LOS: 1 day   Rickey Barbara, MD [facility] Pager On Amion  If 7PM-7AM, please contact night-coverage 03/12/2019, 6:06 PM

## 2019-03-13 ENCOUNTER — Inpatient Hospital Stay (HOSPITAL_COMMUNITY): Payer: No Typology Code available for payment source

## 2019-03-13 DIAGNOSIS — J189 Pneumonia, unspecified organism: Secondary | ICD-10-CM

## 2019-03-13 DIAGNOSIS — A419 Sepsis, unspecified organism: Principal | ICD-10-CM

## 2019-03-13 DIAGNOSIS — Z515 Encounter for palliative care: Secondary | ICD-10-CM

## 2019-03-13 DIAGNOSIS — J690 Pneumonitis due to inhalation of food and vomit: Secondary | ICD-10-CM

## 2019-03-13 DIAGNOSIS — R6520 Severe sepsis without septic shock: Secondary | ICD-10-CM

## 2019-03-13 DIAGNOSIS — E872 Acidosis: Secondary | ICD-10-CM

## 2019-03-13 DIAGNOSIS — Z7189 Other specified counseling: Secondary | ICD-10-CM

## 2019-03-13 LAB — CBC
HCT: 39.4 % (ref 39.0–52.0)
Hemoglobin: 12.2 g/dL — ABNORMAL LOW (ref 13.0–17.0)
MCH: 31 pg (ref 26.0–34.0)
MCHC: 31 g/dL (ref 30.0–36.0)
MCV: 100.3 fL — ABNORMAL HIGH (ref 80.0–100.0)
Platelets: 200 10*3/uL (ref 150–400)
RBC: 3.93 MIL/uL — ABNORMAL LOW (ref 4.22–5.81)
RDW: 14 % (ref 11.5–15.5)
WBC: 22.8 10*3/uL — ABNORMAL HIGH (ref 4.0–10.5)
nRBC: 0 % (ref 0.0–0.2)

## 2019-03-13 LAB — BLOOD GAS, ARTERIAL
Acid-base deficit: 20.6 mmol/L — ABNORMAL HIGH (ref 0.0–2.0)
Bicarbonate: 9.9 mmol/L — ABNORMAL LOW (ref 20.0–28.0)
Drawn by: 308601
FIO2: 60
O2 Saturation: 93.2 %
Patient temperature: 98.6
pCO2 arterial: 40.1 mmHg (ref 32.0–48.0)
pH, Arterial: 7.02 — CL (ref 7.350–7.450)
pO2, Arterial: 96.1 mmHg (ref 83.0–108.0)

## 2019-03-13 LAB — COMPREHENSIVE METABOLIC PANEL
ALT: 29 U/L (ref 0–44)
AST: 74 U/L — ABNORMAL HIGH (ref 15–41)
Albumin: 3.6 g/dL (ref 3.5–5.0)
Alkaline Phosphatase: 49 U/L (ref 38–126)
Anion gap: 14 (ref 5–15)
BUN: 39 mg/dL — ABNORMAL HIGH (ref 8–23)
CO2: 15 mmol/L — ABNORMAL LOW (ref 22–32)
Calcium: 8.3 mg/dL — ABNORMAL LOW (ref 8.9–10.3)
Chloride: 115 mmol/L — ABNORMAL HIGH (ref 98–111)
Creatinine, Ser: 1.2 mg/dL (ref 0.61–1.24)
GFR calc Af Amer: >60 mL/min (ref >60)
GFR calc non Af Amer: 52 mL/min — ABNORMAL LOW (ref >60)
Glucose, Bld: 136 mg/dL — ABNORMAL HIGH (ref 70–99)
Potassium: 4.4 mmol/L (ref 3.5–5.1)
Sodium: 144 mmol/L (ref 135–145)
Total Bilirubin: 1 mg/dL (ref 0.3–1.2)
Total Protein: 7.2 g/dL (ref 6.5–8.1)

## 2019-03-13 LAB — GLUCOSE, CAPILLARY: Glucose-Capillary: 136 mg/dL — ABNORMAL HIGH (ref 70–99)

## 2019-03-13 MED ORDER — GLYCOPYRROLATE 0.2 MG/ML IJ SOLN: 0.2000 mg | INTRAMUSCULAR | Status: DC | PRN

## 2019-03-13 MED ORDER — FUROSEMIDE 10 MG/ML IJ SOLN: 40.0000 mg | Freq: Once | INTRAMUSCULAR | Status: DC

## 2019-03-13 MED ORDER — BIOTENE DRY MOUTH MT LIQD: 15.0000 mL | OROMUCOSAL | Status: DC | PRN

## 2019-03-13 MED ORDER — ACETAMINOPHEN 650 MG RE SUPP: 650.0000 mg | Freq: Four times a day (QID) | RECTAL | Status: DC | PRN

## 2019-03-13 MED ORDER — LORAZEPAM 2 MG/ML IJ SOLN
1.0000 mg | INTRAMUSCULAR | Status: DC | PRN
  Administered 2019-03-13: 1 mg via INTRAVENOUS
  Filled 2019-03-13: qty 1

## 2019-03-13 MED ORDER — SODIUM CHLORIDE 0.9 % IV SOLN
INTRAVENOUS | Status: DC | PRN
  Administered 2019-03-13: 1000 mL via INTRAVENOUS
  Administered 2019-03-16: 500 mL via INTRAVENOUS

## 2019-03-13 MED ORDER — CHLORHEXIDINE GLUCONATE CLOTH 2 % EX PADS
6.0000 | MEDICATED_PAD | Freq: Every day | CUTANEOUS | Status: DC
  Administered 2019-03-13 – 2019-03-15 (×3): 6 via TOPICAL

## 2019-03-13 MED ORDER — MORPHINE SULFATE (PF) 2 MG/ML IV SOLN
1.0000 mg | INTRAVENOUS | Status: DC | PRN
  Administered 2019-03-13 – 2019-03-14 (×6): 1 mg via INTRAVENOUS
  Filled 2019-03-13 (×6): qty 1

## 2019-03-13 MED ORDER — GLYCOPYRROLATE 1 MG PO TABS: 1.0000 mg | ORAL_TABLET | ORAL | Status: DC | PRN

## 2019-03-13 MED ORDER — ONDANSETRON 4 MG PO TBDP: 4.0000 mg | ORAL_TABLET | Freq: Four times a day (QID) | ORAL | Status: DC | PRN

## 2019-03-13 MED ORDER — GLYCOPYRROLATE 0.2 MG/ML IJ SOLN
0.2000 mg | INTRAMUSCULAR | Status: DC | PRN
  Administered 2019-03-15: 0.2 mg via SUBCUTANEOUS
  Filled 2019-03-13: qty 1

## 2019-03-13 MED ORDER — LORAZEPAM 1 MG PO TABS: 1.0000 mg | ORAL_TABLET | ORAL | Status: DC | PRN

## 2019-03-13 MED ORDER — FUROSEMIDE 10 MG/ML IJ SOLN
40.0000 mg | Freq: Once | INTRAMUSCULAR | Status: AC
  Administered 2019-03-13: 40 mg via INTRAVENOUS
  Filled 2019-03-13: qty 4

## 2019-03-13 MED ORDER — ONDANSETRON HCL 4 MG/2ML IJ SOLN: 4.0000 mg | Freq: Four times a day (QID) | INTRAMUSCULAR | Status: DC | PRN

## 2019-03-13 MED ORDER — LORAZEPAM 2 MG/ML PO CONC: 1.0000 mg | ORAL | Status: DC | PRN

## 2019-03-13 MED ORDER — ACETAMINOPHEN 325 MG PO TABS: 650.0000 mg | ORAL_TABLET | Freq: Four times a day (QID) | ORAL | Status: DC | PRN

## 2019-03-13 MED ORDER — POLYVINYL ALCOHOL 1.4 % OP SOLN: 1.0000 [drp] | Freq: Four times a day (QID) | OPHTHALMIC | Status: DC | PRN

## 2019-03-13 NOTE — Progress Notes (Signed)
PROGRESS NOTE    Frances Joynt [facility]  TDV:[id number] DOB: [birth year] DOA: 04/06/2019 PCP: Almetta Lovely, [facility]    Brief Narrative:   Paul Cox is a 86 year old male with history of advanced Alzheimer's dementia, PVD, frequent UTIs, vitamin D deficiency was sent from skilled nursing facility for low BP, fevers and UTI. Patient has severe dementia and unable to give any history. Per EMS, nursing home report, patient had some vomiting and was noticed to have strong smelling urine. He was noticed to have low-grade temp, O2 sats 90% on room air and was placed on 2 L.   Assessment & Plan:   Principal Problem:   Sepsis (HCC) Active Problems:   Chronic dementia (HCC)   Acute lower UTI   AKI (acute kidney injury) (HCC)   Lactic acidosis   Sepsis due to pneumonia (HCC)   Sepsis; present on admission Acute UTI Community-acquired pneumonia vs aspiration pneumonia Presenting from SNF with fever, hypotension, acute renal failure, lactic acidosis with leukocytosis.  Urinalysis with moderate leukoesterase, negative nitrite, and greater than 50 WBCs.  X-ray with patchy left greater than right perihilar density reflecting edema versus pneumonia.  Given his advanced dementia and severe sepsis, palliative care was consulted and followed during the hospital course.  Patient was initially started on empiric antibiotics with azithromycin, vancomycin, and cefepime.  Urine culture with multiple species.  CT abdomen/pelvis with cystitis appearance, chronic outlet obstruction with bladder diverticulosis and 14 mm pancreatic cyst which is stable in appearance.  Patient's clinical status continued to deteriorate throughout the hospital course despite aggressive measures.  Discussed with patient's son, Kiyan at bedside on 03/13/2019; given patient's rapid decline without improvement, he wishes for his father to focus on a more palliative/comfort approach at this time. --Discontinue all aggressive  measures to include IV fluid, IV antibiotics --End-of-life care order set initiated with IV morphine, Ativan, robinul --Comfort measures, anticipate in-hospital death  Advanced dementia --Discontinue home Aricept --Ativan as above as needed  AKI (acute kidney injury) (HCC)withLactic acidosis -Creatinine1.5, baseline creatinine 1.07in2019.  CT abdomen/pelvis notable for chronic bladder outlet obstruction. Cr 1.5-->1.37-->1.20 --Discontinue IV fluids, now transitioning to comfort measures  Prolonged QTC --Discontinue telemetry, now on comfort measures  History of DVT --Discontinue apixaban now on comfort measures.   DVT prophylaxis: Hospice/comfort measures Code Status: DNR Family Communication: son at bedside, Kamerin Disposition Plan: Transfer to med/surg, now on hospice/comfort measures, anticipate in-hospital death   Consultants:   Palliative care  Procedures:   Foley catheter - 1/6  Antimicrobials:   Vancomycin 1/4 - 1/6  Cefepime 1/4 - 1/6  Azithromycin 1/4 - 1/6   Subjective: Called to bedside early this morning for a rapid response.  Patient with worsening hypoxia, now titrated up to 8 L per nasal cannula.  Nursing has lost IV access overnight.  Repeat chest x-ray this morning with worsening vascular congestion likely secondary to pulmonary edema.  Transferred to stepdown unit.  ABG with acidosis with pH 7.02, PaCO2 40.1 and PaO2 96.1 on 8 L high flow nasal cannula.  Patient's son, Emigdio arrived at bedside.  Discussed with him, as well as palliative care this morning given progressive decline despite aggressive measures; and patient's son wishes to transition to comfort measures at this time.  Patient son questions answered at bedside.  No other concerns at this time.  Patient remains obtunded and in respiratory distress on 8 L nasal cannula, unable to obtain any further ROS due to his current mental status.  No other acute concerns  per nursing staff at this  time.  Objective: Vitals:   03/13/19 1006 03/13/19 1016 03/13/19 1028 03/13/19 1200  BP:   123/78   Pulse:   (!) 118   Resp:   (!) 38   Temp:    (!) 97.5 F (36.4 C)  TempSrc:      SpO2: 92% (!) 84% (!) 88%   Weight:      Height:        Intake/Output Summary (Last 24 hours) at 03/13/2019 1236 Last data filed at 03/13/2019 1011 Gross per 24 hour  Intake 4139.82 ml  Output 230 ml  Net 3909.82 ml   Filed Weights   03/10/2019 0954  Weight: 79.4 kg    Examination:  General exam: Moderate respiratory distress Respiratory system: Coarse breath sounds bilaterally with crackles, increased respiratory effort with some accessory muscle use, on 8L high flow nasal cannula Cardiovascular system: S1 & S2 heard, RRR. No JVD, murmurs, rubs, gallops or clicks. No pedal edema. Gastrointestinal system: Abdomen is nondistended, soft and nontender. No organomegaly or masses felt. Normal bowel sounds heard. Central nervous system: Obtunded Extremities: Moves all extremities independently Skin: No rashes, lesions or ulcers Psychiatry: Unable to assess secondary to current mental status    Data Reviewed: I have personally reviewed following labs and imaging studies  CBC: Recent Labs  Lab 03/20/2019 0949 03/28/2019 2108 03/12/19 0336 03/13/19 0436  WBC 27.0* 22.0* 20.2* 22.8*  NEUTROABS 24.8*  --   --   --   HGB 11.9* 10.0* 10.4* 12.2*  HCT 37.4* 30.8* 32.6* 39.4  MCV 96.6 96.6 98.2 100.3*  PLT 201 227 151 200   Basic Metabolic Panel: Recent Labs  Lab 03/09/2019 0949 04/05/2019 2108 04/04/2019 2353 03/12/19 0821 03/13/19 0436  NA 141  --  139  --  144  K 4.2  --  5.5* 4.2 4.4  CL 108  --  112*  --  115*  CO2 21*  --  20*  --  15*  GLUCOSE 119*  --  115*  --  136*  BUN 27*  --  29*  --  39*  CREATININE 1.50*  --  1.37*  --  1.20  CALCIUM 8.9  --  7.7*  --  8.3*  MG  --  1.9  --   --   --    GFR: Estimated Creatinine Clearance: 38.9 mL/min (by C-G formula based on SCr of 1.2  mg/dL). Liver Function Tests: Recent Labs  Lab 03/30/2019 0949 03/13/19 0436  AST 32 74*  ALT 17 29  ALKPHOS 47 49  BILITOT 0.9 1.0  PROT 6.8 7.2  ALBUMIN 3.4* 3.6   No results for input(s): LIPASE, AMYLASE in the last 168 hours. No results for input(s): AMMONIA in the last 168 hours. Coagulation Profile: Recent Labs  Lab 04/02/2019 0949  INR 1.5*   Cardiac Enzymes: No results for input(s): CKTOTAL, CKMB, CKMBINDEX, TROPONINI in the last 168 hours. BNP (last 3 results) No results for input(s): PROBNP in the last 8760 hours. HbA1C: No results for input(s): HGBA1C in the last 72 hours. CBG: Recent Labs  Lab 03/13/19 0933  GLUCAP 136*   Lipid Profile: No results for input(s): CHOL, HDL, LDLCALC, TRIG, CHOLHDL, LDLDIRECT in the last 72 hours. Thyroid Function Tests: No results for input(s): TSH, T4TOTAL, FREET4, T3FREE, THYROIDAB in the last 72 hours. Anemia Panel: No results for input(s): VITAMINB12, FOLATE, FERRITIN, TIBC, IRON, RETICCTPCT in the last 72 hours. Sepsis Labs: Recent Labs  Lab 03/26/2019  5053 07-Apr-2019 1206 04/07/2019 2108 04-07-19 2353 03/12/19 0821  PROCALCITON 37.79  --   --   --   --   LATICACIDVEN 3.9* 3.4* 2.6* 2.0* 1.8    Recent Results (from the past 240 hour(s))  Culture, blood (Routine x 2)     Status: None (Preliminary result)   Collection Time: 2019/04/07  9:49 AM   Specimen: BLOOD RIGHT FOREARM  Result Value Ref Range Status   Specimen Description   Final    BLOOD RIGHT FOREARM Performed at [facility] [address] [place], [place] [address]    Special Requests   Final    BOTTLES DRAWN AEROBIC AND ANAEROBIC Blood Culture adequate volume Performed at [facility] [address], [place], [place] [address]    Culture   Final    NO GROWTH 1 DAY Performed at [facility], [place] [address] [place], [place] [address]    Report Status PENDING  Incomplete  Culture, blood (Routine x 2)      Status: None (Preliminary result)   Collection Time: 04/07/19  9:49 AM   Specimen: BLOOD  Result Value Ref Range Status   Specimen Description   Final    BLOOD RIGHT ANTECUBITAL Performed at [facility] [address], [place], [place] [address]    Special Requests   Final    BOTTLES DRAWN AEROBIC ONLY Blood Culture adequate volume Performed at [facility] [address] [place], [place] [address]    Culture   Final    NO GROWTH 1 DAY Performed at [facility], [place] [address] [place], [place] [address]    Report Status PENDING  Incomplete  Urine culture     Status: Abnormal   Collection Time: 04-07-19 12:06 PM   Specimen: In/Out Cath Urine  Result Value Ref Range Status   Specimen Description   Final    IN/OUT CATH URINE Performed at [facility] [address] [place], [place] [address]    Special Requests   Final    NONE Performed at [facility], [place] [address], [place],  [address]    Culture MULTIPLE SPECIES PRESENT, SUGGEST RECOLLECTION (A)  Final   Report Status 03/12/2019 FINAL  Final  Respiratory Panel by RT PCR (Flu A&B, Covid) - Nasopharyngeal Swab     Status: None   Collection Time: 2019-04-07 12:06 PM   Specimen: Nasopharyngeal Swab  Result Value Ref Range Status   SARS Coronavirus 2 by RT PCR NEGATIVE NEGATIVE Final    Comment: (NOTE) SARS-CoV-2 target nucleic acids are NOT DETECTED. The SARS-CoV-2 RNA is generally detectable in upper respiratoy specimens during the acute phase of infection. The lowest concentration of SARS-CoV-2 viral copies this assay can detect is 131 copies/mL. A negative result does not preclude SARS-Cov-2 infection and should not be used as the sole basis for treatment or other patient management decisions. A negative result may occur with  improper specimen collection/handling, submission of specimen other than nasopharyngeal swab, presence  of viral mutation(s) within the areas targeted by this assay, and inadequate number of viral copies (<131 copies/mL). A negative result must be combined with clinical observations, patient history, and epidemiological information. The expected result is Negative. Fact Sheet for Patients:  PinkCheek.be Fact Sheet for Healthcare Providers:  GravelBags.it This test is not yet ap proved or cleared by the [place] FDA and  has been authorized for detection and/or diagnosis of  SARS-CoV-2 by FDA under an Emergency Use Authorization (EUA). This EUA will remain  in effect (meaning this test can be used) for the duration of the COVID-19 declaration under Section 564(b)(1) of the Act, 21 U.S.C. section 360bbb-3(b)(1), unless the authorization is terminated or revoked sooner.    Influenza A by PCR NEGATIVE NEGATIVE Final   Influenza B by PCR NEGATIVE NEGATIVE Final    Comment: (NOTE) The Xpert Xpress SARS-CoV-2/FLU/RSV assay is intended as an aid in  the diagnosis of influenza from Nasopharyngeal swab specimens and  should not be used as a sole basis for treatment. Nasal washings and  aspirates are unacceptable for Xpert Xpress SARS-CoV-2/FLU/RSV  testing. Fact Sheet for Patients: https://www.moore.com/ Fact Sheet for Healthcare Providers: https://www.young.biz/ This test is not yet approved or cleared by the [place] FDA and  has been authorized for detection and/or diagnosis of SARS-CoV-2 by  FDA under an Emergency Use Authorization (EUA). This EUA will remain  in effect (meaning this test can be used) for the duration of the  Covid-19 declaration under Section 564(b)(1) of the Act, 21  U.S.C. section 360bbb-3(b)(1), unless the authorization is  terminated or revoked. Performed at [facility], [address] [place], [place] [address]   MRSA PCR Screening      Status: None   Collection Time: 03/12/19  3:51 PM   Specimen: Nasal Mucosa; Nasopharyngeal  Result Value Ref Range Status   MRSA by PCR NEGATIVE NEGATIVE Final    Comment:        The GeneXpert MRSA Assay (FDA approved for NASAL specimens only), is one component of a comprehensive MRSA colonization surveillance program. It is not intended to diagnose MRSA infection nor to guide or monitor treatment for MRSA infections. Performed at [facility], [address], [place], [place] [address]          Radiology Studies: DG CHEST PORT 1 VIEW  Result Date: 03/13/2019 CLINICAL DATA:  Dyspnea EXAM: PORTABLE CHEST 1 VIEW COMPARISON:  Fall 2 days ago FINDINGS: Cardiomegaly and vascular pedicle widening. Diffuse interstitial opacity is increased. Haziness at the right base which may reflect pleural fluid. Artifact from EKG leads. IMPRESSION: New interstitial opacities most consistent with edema. Electronically Signed   By: Marnee Spring M.D.   On: 03/13/2019 08:09        Scheduled Meds: . acetaminophen  975 mg Rectal Once  . Chlorhexidine Gluconate Cloth  6 each Topical Daily   Continuous Infusions: . sodium chloride 1,000 mL (03/13/19 0926)     LOS: 2 days    Time spent: 42 minutes spent on chart review, discussion with nursing staff, consultants, updating family and interview/physical exam; more than 50% of that time was spent in counseling and/or coordination of care.    Alvira Philips [name], DO [facility] 03/13/2019, 12:36 PM

## 2019-03-13 NOTE — Progress Notes (Addendum)
Rapid RN called. Fatima Sanger, DO verbalized he's putting in orders for stepdown.

## 2019-03-13 NOTE — Progress Notes (Addendum)
[name], [name], MD paged regarding the pt's MEWS of 5. Spo2 88% on 3L, HR 111, respirations 40. Pt responds to voice.   MD ordered stat x-ray. Will continue to monitor.

## 2019-03-13 NOTE — Progress Notes (Signed)
[name], [name], DO paged regarding the pt's continued SOB, diaphoresis, and mews score of 5. Transfer requested d/t the pt's current status.

## 2019-03-13 NOTE — Consult Note (Signed)
Consultation Note Date: 03/13/2019   Patient Name: Paul Cox  DOB: 10-20-1924  MRN: [id number]  Age / Sex: 86 y.o., male  PCP: [facility] Referring Physician: [name], [name] J, DO  Reason for Consultation: Establishing goals of care  HPI/Patient Profile: 86 y.o. male  admitted on 2019-03-17   86 year old gentleman from [place] at [facility], sent from his skilled nursing facility on March 17, 2019 for fever, low blood pressures and a urinary tract infection.  Patient remains admitted to hospital medicine service since then.  He has past medical history significant for Alzheimer's dementia, frequent urinary tract infections, peripheral vascular disease and vitamin D deficiency.  Patient was found to have some vomiting and strong smelling urine low blood pressures and low oxygen saturations at the time of being evaluated in the emergency department.  With his influenza panel being negative and COVID-19 test being negative, patient was admitted with a working diagnosis of sepsis secondary to acute urinary tract infection, acute pneumonia possibly aspiration based etiology versus community-acquired pneumonia.  Patient was given IV fluid resuscitation, was given IV antibiotics.  Subsequently was given broad-spectrum antibiotics.   Clinical Assessment and Goals of Care:  Patient has had ongoing encephalopathy off and on in this hospitalization.  He has not been able to successfully participate in SLP evaluation.  Moreover, in a.m. on 03-13-2019, the patient was noted to have blood pressure of 79/57, heart rate of 112, respiratory rate of 39, oxygen saturation of 87% on 6 L.  A rapid response was initiated.  Reportedly, the patient is in route to being transferred to stepdown unit for further evaluation/stabilization.  Palliative consultation has been requested for goals of care discussions.  Patient  appears in mild distress.  He is awake but confused.  He is wearing mittens.  No family present at the bedside.  Call placed and discussed with son who has the same name as the patient.  Introduced palliative care, goals of care, nature of my phone call as follows:  Palliative medicine is specialized medical care for people living with serious illness. It focuses on providing relief from the symptoms and stress of a serious illness. The goal is to improve quality of life for both the patient and the family.  Goals of care: Broad aims of medical therapy in relation to the patient's values and preferences. Our aim is to provide medical care aimed at enabling patients to achieve the goals that matter most to them, given the circumstances of their particular medical situation and their constraints.   We reviewed together the scope of this hospitalization.  Discussed about the patient's underlying condition of dementia.  Discussed about patient's current infections.  Appropriate medications are being given.  Patient overall, in my opinion, continues to have downward decline trajectory and in my opinion, is not progressing towards any meaningful recovery.  Hence, in the situations, focusing exclusively on comfort measures and considering hospice philosophy of care is sometimes more prudent.  This was discussed with the patient's son.  Patient son states that  he will visit with the patient later today.  He hopes that the patient will stabilize somewhat.  Palliative medicine team to continue to follow along and help guide appropriate decision making and appropriate disposition options.  Thank you for the consult.    HCPOA Son Jaquelyn Bitter at [facility] with DO NOT RESUSCITATE Continue current mode of care. Time-limited trial of current interventions for a brief period of time, possibly for the next 24 hours, with ongoing conversations with patient's son about transitioning to  full scope of comfort measures as well as considering hospice philosophy of care if the patient has ongoing decline/no chance of meaningful recovery.  Oral intake remains minimal to nil.  Patient with high risk of ongoing dehydration/active infections, possibly also with ongoing aspiration.  Etiology of silent aspiration discussed in detail with the patient's son on the phone this morning.  Decline trajectory of dementia also discussed.   PMT to continue to follow. Thank you for the consult.  code Status/Advance Care Planning:  DNR    Symptom Management:   Continue current mode of care  Palliative Prophylaxis:   Delirium Protocol   Psycho-social/Spiritual:   Desire for further Chaplaincy support:yes  Additional Recommendations: Education on Hospice  Prognosis:   Guarded.   Discharge Planning: To Be Determined      Primary Diagnoses: Present on Admission: . Sepsis ([place]) . Chronic dementia ([place]) . Acute lower UTI . AKI (acute kidney injury) ([place]) . Lactic acidosis . Sepsis due to pneumonia [facility])   I have reviewed the medical record, interviewed the patient and family, and examined the patient. The following aspects are pertinent.  Past Medical History:  Diagnosis Date  . Dementia ([place])   . PVD (peripheral vascular disease) ([place])   . Unsteadiness on feet   . Urinary incontinence   . Vitamin D deficiency    Social History   Socioeconomic History  . Marital status: Widowed    Spouse name: Not on file  . Number of children: Not on file  . Years of education: Not on file  . Highest education level: Not on file  Occupational History  . Not on file  Tobacco Use  . Smoking status: Never Smoker  . Smokeless tobacco: Never Used  Substance and Sexual Activity  . Alcohol use: Not Currently  . Drug use: Not Currently  . Sexual activity: Not on file  Other Topics Concern  . Not on file  Social History Narrative  . Not on file   Social Determinants of Health    Financial Resource Strain:   . Difficulty of Paying Living Expenses: Not on file  Food Insecurity:   . Worried About Charity fundraiser in the Last Year: Not on file  . Ran Out of Food in the Last Year: Not on file  Transportation Needs:   . Lack of Transportation (Medical): Not on file  . Lack of Transportation (Non-Medical): Not on file  Physical Activity:   . Days of Exercise per Week: Not on file  . Minutes of Exercise per Session: Not on file  Stress:   . Feeling of Stress : Not on file  Social Connections:   . Frequency of Communication with Friends and Family: Not on file  . Frequency of Social Gatherings with Friends and Family: Not on file  . Attends Religious Services: Not on file  . Active Member of Clubs or Organizations: Not on file  . Attends Archivist Meetings:  Not on file  . Marital Status: Not on file   No family history on file. Scheduled Meds: . acetaminophen  975 mg Rectal Once  . apixaban  5 mg Oral BID  . Chlorhexidine Gluconate Cloth  6 each Topical Daily  . cholecalciferol  1,000 Units Oral Daily  . donepezil  10 mg Oral QHS  . vitamin B-12  1,000 mcg Oral Daily   Continuous Infusions: . sodium chloride 1,000 mL (03/13/19 0926)  . azithromycin 500 mg (03/12/19 1713)  . ceFEPime (MAXIPIME) IV 2 g (03/13/19 0928)  . vancomycin 1,250 mg (03/13/19 1011)   PRN Meds:.sodium chloride, acetaminophen **OR** acetaminophen, ondansetron **OR** ondansetron (ZOFRAN) IV Medications Prior to Admission:  Prior to Admission medications   Medication Sig Start Date End Date Taking? Authorizing Provider  acetaminophen (TYLENOL) 500 MG tablet Take 1,000 mg by mouth every 8 (eight) hours as needed (for pain).    Yes [provider]  apixaban (ELIQUIS) 5 MG TABS tablet Take 5 mg by mouth 2 (two) times daily.   Yes [provider]  Cholecalciferol (VITAMIN D3) 25 MCG (1000 UT) CAPS Take 1,000 Units by mouth daily.   Yes [provider]  donepezil (ARICEPT) 10 MG tablet Take 10 mg by mouth at bedtime.   Yes [provider]  loperamide (IMODIUM A-D) 2 MG tablet Take 2 mg by mouth every 6 (six) hours as needed for diarrhea or loose stools.   Yes [provider]  Multiple Vitamins-Minerals (ONE-A-DAY PROACTIVE 65+) TABS Take 1 tablet by mouth daily.   Yes [provider]  QUEtiapine (SEROQUEL) 25 MG tablet Take 25 mg by mouth 2 (two) times daily.   Yes [provider]  QUEtiapine (SEROQUEL) 25 MG tablet Take 25 mg by mouth 2 (two) times daily.    Yes [provider]  Skin Protectants, Misc. (BAZA PROTECT EX) Apply 1 application topically 3 (three) times daily. Apply to bottom every shift for redness   Yes [provider]  traZODone (DESYREL) 25 mg TABS tablet Take 12.5 mg by mouth every 12 (twelve) hours as needed (for agitation).   Yes [provider]  vitamin B-12 (CYANOCOBALAMIN) 1000 MCG tablet Take 1,000 mcg by mouth daily.   Yes [provider]  zolpidem (AMBIEN) 5 MG tablet Take 2.5 mg by mouth at bedtime.   Yes [provider]  senna-docusate (SENOKOT-S) 8.6-50 MG tablet Take 1 tablet by mouth at bedtime as needed for mild constipation. Patient not taking: Reported on 26-Mar-2019 03/05/18   Synetta Shadow, MD   No Known Allergies Review of Systems Confused Has mittens on  Physical Exam Weak appearing gentleman resting in bed Patient is awake but confused.  He has his head turned towards the window.  He mumbles some but I am not able to understand. Regular pattern of breathing S1-S2 Abdomen is not distended He does not have any edema  Vital Signs: BP 120/75 (BP Location: Left Arm)   Pulse (!) 112   Temp 97.9 F (36.6 C) (Axillary)   Resp (!) 45   Ht 5\' 6"  (1.676 m)   Wt 79.4 kg   SpO2 92%   BMI 28.25 kg/m  Pain Scale: 0-10   Pain Score: 0-No pain   SpO2: SpO2: 92 % O2 Device:SpO2: 92 % O2 Flow Rate: .O2 Flow Rate (L/min):  6 L/min  IO: Intake/output summary:   Intake/Output Summary (Last 24 hours) at 03/13/2019 1015 Last data filed at 03/13/2019 05/11/2019 Gross per  24 hour  Intake 3687.47 ml  Output 200 ml  Net 3487.47 ml    LBM: Last BM Date: 03/12/19 Baseline Weight: Weight: 79.4 kg Most recent weight: Weight: 79.4 kg     Palliative Assessment/Data:   PPS 30%  Time In:  9 Time Out:  10 Time Total:  60 min.  Greater than 50%  of this time was spent counseling and coordinating care related to the above assessment and plan.  Signed by: Rosalin Hawking, MD   Please contact Palliative Medicine Team phone at [phone] for questions and concerns.  For individual provider: See Loretha Stapler

## 2019-03-13 NOTE — Progress Notes (Signed)
Report given to Clovis Riley, RN in ICU stepdown. Will transfer pt as soon as room is clean. At the bedside with pt.

## 2019-03-13 NOTE — Progress Notes (Signed)
CBG 135  

## 2019-03-13 NOTE — Progress Notes (Signed)
SLP Cancellation Note  Patient Details Name: Paul Cox MRN: [id number] DOB: [birth year]   Cancelled treatment:        Attempted to see pt for clinical swallowing evaluation.  Pt with change in medical status with decreased respiratory support.  Not medically appropriate for POs at this time and transferring to higher level of care.  SLP will reattempt as schedule permits and pt's condition stabilizes.   Kerrie Pleasure, MA, CCC-SLP Acute Rehabilitation Services 03/13/2019, 9:51 AM

## 2019-03-13 NOTE — Progress Notes (Signed)
Addendum Complete bed bath given this morning, sats 88-92, incontinent of stool, pt continues on antibiotics, pt continued to yell intermittently t/o the night then would settle down.mitts on  hands, he did pull out IV, will request IV team to restart.

## 2019-03-13 NOTE — Progress Notes (Signed)
ABG obtained per order and sent to lab @ 1147; lab aware of incoming sample

## 2019-03-13 NOTE — Progress Notes (Signed)
D/C NS@100  ml/hr per [name], [name], DO. [facility] for ABX.

## 2019-03-13 NOTE — Progress Notes (Signed)
[name], Paul Areola, DO paged regarding the pt's BP of 79/57, HR 112, 39 RR, SpO2 87% on 6L. Pt SOB, pale. CBG 135. Will CTM. Tammy, RN, [facility] made aware and present in pt's room.

## 2019-03-14 DIAGNOSIS — R0602 Shortness of breath: Secondary | ICD-10-CM

## 2019-03-14 MED ORDER — MORPHINE BOLUS VIA INFUSION
2.0000 mg | INTRAVENOUS | Status: DC | PRN
  Filled 2019-03-14: qty 2

## 2019-03-14 MED ORDER — MORPHINE SULFATE 2 MG/ML IV SOLN: INTRAVENOUS | Status: DC

## 2019-03-14 MED ORDER — MORPHINE 100MG IN NS 100ML (1MG/ML) PREMIX INFUSION
2.0000 mg/h | INTRAVENOUS | Status: DC
  Administered 2019-03-14 – 2019-03-16 (×2): 2 mg/h via INTRAVENOUS
  Filled 2019-03-14 (×3): qty 100

## 2019-03-14 NOTE — Progress Notes (Addendum)
Daily Progress Note   Patient Name: Paul Cox       Date: 03/14/2019 DOB: 04/05/1924  Age: 86 y.o. MRN#: [id number] Attending Physician: [place] [name] J, DO Primary Care Physician: Orvis Brill, [facility] Admit Date: 03-25-19  Reason for Consultation/Follow-up: Terminal Care  Subjective:  patient moaning some, appears to be using some accessory muscles of respirations. Bedside nursing staff giving him a bath and providing foley care. He appears in mild to moderate distress, no family at bedside. 87% O2 saturations on 7L O2 [place].   Discussed with son on the phone yesterday, at the time of initial consult, subsequently discussed with [facility] MD, goal remains comfort measures, anticipate hospital death, prognosis hours to some very limited number of days.   Length of Stay: 3  Current Medications: Scheduled Meds:  . acetaminophen  975 mg Rectal Once  . Chlorhexidine Gluconate Cloth  6 each Topical Daily    Continuous Infusions: . sodium chloride 10 mL/hr at 03/13/19 1400  . morphine      PRN Meds: sodium chloride, acetaminophen **OR** acetaminophen, acetaminophen **OR** acetaminophen, antiseptic oral rinse, glycopyrrolate **OR** glycopyrrolate **OR** glycopyrrolate, LORazepam **OR** LORazepam **OR** LORazepam, morphine **AND** morphine, ondansetron **OR** ondansetron (ZOFRAN) IV, ondansetron **OR** ondansetron (ZOFRAN) IV, polyvinyl alcohol  Physical Exam         Weak gentleman Has some bruises on UE Coarse rhonchorous breath sounds S1 S2 Abdomen is not distended Trace edema No coolness no mottling noted Not awake not alert Moans at times  Vital Signs: BP 117/86 (BP Location: Left Arm)   Pulse 98   Temp 97.7 F (36.5 C) (Oral)   Resp 20   Ht 5\' 6"  (1.676 m)   Wt 79.4  kg   SpO2 (!) 87%   BMI 28.25 kg/m  SpO2: SpO2: (!) 87 % O2 Device: O2 Device: Nasal Cannula O2 Flow Rate: O2 Flow Rate (L/min): 7 L/min  Intake/output summary:   Intake/Output Summary (Last 24 hours) at 03/14/2019 1027 Last data filed at 03/13/2019 1400 Gross per 24 hour  Intake 287.43 ml  Output --  Net 287.43 ml   LBM: Last BM Date: 03/12/19 Baseline Weight: Weight: 79.4 kg Most recent weight: Weight: 79.4 kg       Palliative Assessment/Data:      Patient Active Problem List   Diagnosis Date  Noted  . Palliative care encounter 03/13/2019  . Sepsis due to pneumonia (HCC) 03/12/2019  . Sepsis (HCC) 04/06/2019  . Acute lower UTI 2019-04-06  . AKI (acute kidney injury) (HCC) 04-06-19  . Lactic acidosis 04/06/2019  . Hypotension   . Acute diarrhea 03/04/2018  . Intertrigo 03/02/2018  . Chronic dementia (HCC) 03/02/2018  . Diverticulitis 03/01/2018    Palliative Care Assessment & Plan   Patient Profile:    Assessment:  actively dying Comfort care Generalized weakness Dyspnea Advanced demetia Recent UTI Possible recurrent aspiration PNA Shortness of breath  Recommendations/Plan:   will change Morphine PCA to Morphine infusion with provision for bolus dosages for ongoing comfort measures  Continue current mode of care, continue efforts to avoid and minimize suffering and provide dignity at end of life  Prognosis hours to some very limited number of days in my opinion.   Goals of Care and Additional Recommendations:  Limitations on Scope of Treatment: Full Comfort Care  Code Status:    Code Status Orders  (From admission, onward)         Start     Ordered   03/13/19 1234  Do not attempt resuscitation (DNR)  Continuous    Question Answer Comment  In the event of cardiac or respiratory ARREST Do not call a "code blue"   In the event of cardiac or respiratory ARREST Do not perform Intubation, CPR, defibrillation or ACLS   In the event of cardiac  or respiratory ARREST Use medication by any route, position, wound care, and other measures to relive pain and suffering. May use oxygen, suction and manual treatment of airway obstruction as needed for comfort.   Comments Discussed with Azarias Chiou at bedside on 03/13/2019      03/13/19 1235        Code Status History    Date Active Date Inactive Code Status Order ID Comments User Context   04/06/19 1619 03/13/2019 1235 DNR [id number]  Cathren Harsh, MD ED   03/01/2018 2314 03/05/2018 2118 DNR [id number]  Beola Cord, MD ED   Advance Care Planning Activity    Advance Directive Documentation     Most Recent Value  Type of Advance Directive  Out of facility DNR (pink MOST or yellow form)  Pre-existing out of facility DNR order (yellow form or pink MOST form)  Yellow form placed in chart (order not valid for inpatient use)  "MOST" Form in Place?  --       Prognosis:   Hours - Days  Discharge Planning:  Anticipated Hospital Death  Care plan was discussed with  RN  Thank you for allowing the Palliative Medicine Team to assist in the care of this patient.   Time In: 10 Time Out: 10.25 Total Time 25 Prolonged Time Billed  no       Greater than 50%  of this time was spent counseling and coordinating care related to the above assessment and plan.  Rosalin Hawking, MD  Please contact Palliative Medicine Team phone at [phone] for questions and concerns.

## 2019-03-14 NOTE — Progress Notes (Signed)
PROGRESS NOTE    Paul Cox [facility]  CZY:[id number] DOB: 09-18-1924 DOA: Mar 15, 2019 PCP: Almetta Lovely, [facility]    Brief Narrative:   Paul Cox is a 86 year old male with history of advanced Alzheimer's dementia, PVD, frequent UTIs, vitamin D deficiency was sent from skilled nursing facility for low BP, fevers and UTI. Patient has severe dementia and unable to give any history. Per EMS, nursing home report, patient had some vomiting and was noticed to have strong smelling urine. He was noticed to have low-grade temp, O2 sats 90% on room air and was placed on 2 L.   Assessment & Plan:   Principal Problem:   Sepsis (HCC) Active Problems:   Chronic dementia (HCC)   Acute lower UTI   AKI (acute kidney injury) (HCC)   Lactic acidosis   Sepsis due to pneumonia [facility])   Palliative care encounter   Sepsis; present on admission Acute UTI Community-acquired pneumonia vs aspiration pneumonia Presenting from SNF with fever, hypotension, acute renal failure, lactic acidosis with leukocytosis.  Urinalysis with moderate leukoesterase, negative nitrite, and greater than 50 WBCs.  X-ray with patchy left greater than right perihilar density reflecting edema versus pneumonia.  Given his advanced dementia and severe sepsis, palliative care was consulted and followed during the hospital course.  Patient was initially started on empiric antibiotics with azithromycin, vancomycin, and cefepime.  Urine culture with multiple species.  CT abdomen/pelvis with cystitis appearance, chronic outlet obstruction with bladder diverticulosis and 14 mm pancreatic cyst which is stable in appearance.  Patient's clinical status continued to deteriorate throughout the hospital course despite aggressive measures.  Discussed with patient's son, Paul Cox at bedside on 03/13/2019; given patient's rapid decline without improvement, he wishes for his father to focus on a more palliative/comfort approach at this  time. --Discontinue all aggressive measures to include IV fluid, IV antibiotics --End-of-life care order set initiated with morphine infusion, Ativan, robinul --Comfort measures, anticipate in-hospital death  Advanced dementia --Discontinue home Aricept --Ativan as above as needed  AKI (acute kidney injury) (HCC)withLactic acidosis -Creatinine1.5, baseline creatinine 1.07in2019.  CT abdomen/pelvis notable for chronic bladder outlet obstruction. Cr 1.5-->1.37-->1.20 --Discontinued IV fluids, now transitioning to comfort measures  Prolonged QTC --Discontinue telemetry, now on comfort measures  History of DVT --Discontinue apixaban now on comfort measures.   DVT prophylaxis: Hospice/comfort measures Code Status: DNR Family Communication: None present at bedside this morning, updated patient's son Paul Cox extensively at bedside yesterday Disposition Plan: Continue hospice/comfort measures, anticipate in-hospital death; suspect hours to days of life remaining   Consultants:   Palliative care  Procedures:   Foley catheter - 1/6  Antimicrobials:   Vancomycin 1/4 - 1/6  Cefepime 1/4 - 1/6  Azithromycin 1/4 - 1/6   Subjective: Patient seen and examined at bedside, appears to be in some mild/moderate distress.  Obtunded.  Has used several IV doses of morphine overnight, will transition to morphine infusion.  Palliative care continues to follow with assistance in medication management.  No family present at bedside this morning.  No specific concerns per nursing staff.  Objective: Vitals:   03/13/19 1300 03/13/19 1400 03/13/19 1559 03/14/19 0749  BP:   (!) 77/46 117/86  Pulse:  (!) 38 (!) 101 98  Resp: (!) 37 (!) 39 (!) 33 20  Temp:   98.4 F (36.9 C) 97.7 F (36.5 C)  TempSrc:   Oral Oral  SpO2:  (!) 85% (!) 61% (!) 87%  Weight:      Height:  No intake or output data in the 24 hours ending 03/14/19 1517 Filed Weights   03/15/2019 0954  Weight: 79.4 kg     Examination:  General exam: Moderate respiratory distress, unresponsive/obtunded Respiratory system: Coarse breath sounds bilaterally with crackles, increased respiratory effort with some accessory muscle use, on supplemental oxygen via nasal cannula Cardiovascular system: S1 & S2 heard, RRR. No JVD, murmurs, rubs, gallops or clicks. No pedal edema. Gastrointestinal system: Abdomen is nondistended, soft and nontender. No organomegaly or masses felt. Normal bowel sounds heard. Central nervous system: Obtunded Extremities: Moves extremities occasionally but not to command Skin: No rashes, lesions or ulcers Psychiatry: Unable to assess secondary to current mental status    Data Reviewed: I have personally reviewed following labs and imaging studies  CBC: Recent Labs  Lab 03/13/2019 0949 03/19/2019 2108 03/12/19 0336 03/13/19 0436  WBC 27.0* 22.0* 20.2* 22.8*  NEUTROABS 24.8*  --   --   --   HGB 11.9* 10.0* 10.4* 12.2*  HCT 37.4* 30.8* 32.6* 39.4  MCV 96.6 96.6 98.2 100.3*  PLT 201 227 151 200   Basic Metabolic Panel: Recent Labs  Lab 03/18/2019 0949 03/20/2019 2108 03/17/2019 2353 03/12/19 0821 03/13/19 0436  NA 141  --  139  --  144  K 4.2  --  5.5* 4.2 4.4  CL 108  --  112*  --  115*  CO2 21*  --  20*  --  15*  GLUCOSE 119*  --  115*  --  136*  BUN 27*  --  29*  --  39*  CREATININE 1.50*  --  1.37*  --  1.20  CALCIUM 8.9  --  7.7*  --  8.3*  MG  --  1.9  --   --   --    GFR: Estimated Creatinine Clearance: 38.9 mL/min (by C-G formula based on SCr of 1.2 mg/dL). Liver Function Tests: Recent Labs  Lab 03/15/2019 0949 03/13/19 0436  AST 32 74*  ALT 17 29  ALKPHOS 47 49  BILITOT 0.9 1.0  PROT 6.8 7.2  ALBUMIN 3.4* 3.6   No results for input(s): LIPASE, AMYLASE in the last 168 hours. No results for input(s): AMMONIA in the last 168 hours. Coagulation Profile: Recent Labs  Lab 04/05/2019 0949  INR 1.5*   Cardiac Enzymes: No results for input(s): CKTOTAL, CKMB,  CKMBINDEX, TROPONINI in the last 168 hours. BNP (last 3 results) No results for input(s): PROBNP in the last 8760 hours. HbA1C: No results for input(s): HGBA1C in the last 72 hours. CBG: Recent Labs  Lab 03/13/19 0933  GLUCAP 136*   Lipid Profile: No results for input(s): CHOL, HDL, LDLCALC, TRIG, CHOLHDL, LDLDIRECT in the last 72 hours. Thyroid Function Tests: No results for input(s): TSH, T4TOTAL, FREET4, T3FREE, THYROIDAB in the last 72 hours. Anemia Panel: No results for input(s): VITAMINB12, FOLATE, FERRITIN, TIBC, IRON, RETICCTPCT in the last 72 hours. Sepsis Labs: Recent Labs  Lab 03/23/2019 0949 03/26/2019 1206 03/29/2019 2108 03/29/2019 2353 03/12/19 0821  PROCALCITON 37.79  --   --   --   --   LATICACIDVEN 3.9* 3.4* 2.6* 2.0* 1.8    Recent Results (from the past 240 hour(s))  Culture, blood (Routine x 2)     Status: None (Preliminary result)   Collection Time: 03/12/2019  9:49 AM   Specimen: BLOOD RIGHT FOREARM  Result Value Ref Range Status   Specimen Description   Final    BLOOD RIGHT FOREARM Performed at [facility],  [address] [place], [place] [address]    Special Requests   Final    BOTTLES DRAWN AEROBIC AND ANAEROBIC Blood Culture adequate volume Performed at [facility], [address] [place], [place] [address]    Culture   Final    NO GROWTH 3 DAYS Performed at [facility], [address], [place], [place] [address]    Report Status PENDING  Incomplete  Culture, blood (Routine x 2)     Status: None (Preliminary result)   Collection Time: April 07, 2019  9:49 AM   Specimen: BLOOD  Result Value Ref Range Status   Specimen Description   Final    BLOOD RIGHT ANTECUBITAL Performed at [facility], [address] [place], [place] [address]    Special Requests   Final    BOTTLES DRAWN AEROBIC ONLY Blood Culture adequate volume Performed at [facility], [address], [place], [place] [address]    Culture   Final    NO GROWTH 3 DAYS Performed at [facility], [address], [place], [place] [address]    Report Status PENDING  Incomplete  Urine culture     Status: Abnormal   Collection Time: 04-07-19 12:06 PM   Specimen: In/Out Cath Urine  Result Value Ref Range Status   Specimen Description   Final    IN/OUT CATH URINE Performed at [facility], [address] [place], [place] [address]    Special Requests   Final    NONE Performed at [facility], [address], [place], [place] [address]    Culture MULTIPLE SPECIES PRESENT, SUGGEST RECOLLECTION (A)  Final   Report Status 03/12/2019 FINAL  Final  Respiratory Panel by RT PCR (Flu A&B, Covid) - Nasopharyngeal Swab     Status: None   Collection Time: 2019-04-07 12:06 PM   Specimen: Nasopharyngeal Swab  Result Value Ref Range Status   SARS Coronavirus 2 by RT PCR NEGATIVE NEGATIVE Final    Comment: (NOTE) SARS-CoV-2 target nucleic acids are NOT DETECTED. The SARS-CoV-2 RNA is generally detectable in upper respiratoy specimens during the acute phase of infection. The lowest concentration of SARS-CoV-2 viral copies this assay can detect is 131 copies/mL. A negative result does not preclude SARS-Cov-2 infection and should not be used as the sole basis for treatment or other patient management decisions. A negative result may occur with  improper specimen collection/handling, submission of specimen other than nasopharyngeal swab, presence of viral mutation(s) within the areas targeted by this assay, and inadequate number of viral copies (<131 copies/mL). A negative result must be combined with clinical observations, patient history, and epidemiological information. The expected result is Negative. Fact Sheet for Patients:  https://www.moore.com/ Fact Sheet for Healthcare Providers:  https://www.young.biz/ This  test is not yet ap proved or cleared by the [place] FDA and  has been authorized for detection and/or diagnosis of SARS-CoV-2 by FDA under an Emergency Use Authorization (EUA). This EUA will remain  in effect (meaning this test can be used) for the duration of the COVID-19 declaration under Section 564(b)(1) of the Act, 21 U.S.C. section 360bbb-3(b)(1), unless the authorization is terminated or revoked sooner.    Influenza A by PCR NEGATIVE NEGATIVE Final   Influenza B by PCR NEGATIVE NEGATIVE Final    Comment: (NOTE) The Xpert Xpress SARS-CoV-2/FLU/RSV assay is intended as an aid in  the diagnosis of influenza from Nasopharyngeal swab specimens and  should  not be used as a sole basis for treatment. Nasal washings and  aspirates are unacceptable for Xpert Xpress SARS-CoV-2/FLU/RSV  testing. Fact Sheet for Patients: PinkCheek.be Fact Sheet for Healthcare Providers: GravelBags.it This test is not yet approved or cleared by the [place] FDA and  has been authorized for detection and/or diagnosis of SARS-CoV-2 by  FDA under an Emergency Use Authorization (EUA). This EUA will remain  in effect (meaning this test can be used) for the duration of the  Covid-19 declaration under Section 564(b)(1) of the Act, 21  U.S.C. section 360bbb-3(b)(1), unless the authorization is  terminated or revoked. Performed at [facility], [place] [address] [place], [place] [address]   MRSA PCR Screening     Status: None   Collection Time: 03/12/19  3:51 PM   Specimen: Nasal Mucosa; Nasopharyngeal  Result Value Ref Range Status   MRSA by PCR NEGATIVE NEGATIVE Final    Comment:        The GeneXpert MRSA Assay (FDA approved for NASAL specimens only), is one component of a comprehensive MRSA colonization surveillance program. It is not intended to diagnose MRSA infection nor to guide or monitor treatment for MRSA  infections. Performed at [facility], [place] [address], [place], [place] [address]          Radiology Studies: DG CHEST PORT 1 VIEW  Result Date: 03/13/2019 CLINICAL DATA:  Dyspnea EXAM: PORTABLE CHEST 1 VIEW COMPARISON:  Fall 2 days ago FINDINGS: Cardiomegaly and vascular pedicle widening. Diffuse interstitial opacity is increased. Haziness at the right base which may reflect pleural fluid. Artifact from EKG leads. IMPRESSION: New interstitial opacities most consistent with edema. Electronically Signed   By: Monte Fantasia M.D.   On: 03/13/2019 08:09        Scheduled Meds: . acetaminophen  975 mg Rectal Once  . Chlorhexidine Gluconate Cloth  6 each Topical Daily   Continuous Infusions: . sodium chloride 10 mL/hr at 03/13/19 1400  . morphine 2 mg/hr (03/14/19 1129)     LOS: 3 days    Time spent: 33 minutes spent on chart review, discussion with nursing staff, consultants, updating family and interview/physical exam; more than 50% of that time was spent in counseling and/or coordination of care.    Bama Hanselman J [place] DO [facility] 03/14/2019, 3:17 PM

## 2019-03-14 NOTE — Plan of Care (Signed)

## 2019-03-15 NOTE — Care Management Important Message (Signed)
Important Message  Patient Details IM Letter given to Sandford Craze RN Case Manager to present to the Patient Name: Paul Cox MRN: [id number] Date of Birth: 1924/09/21   Medicare Important Message Given:  Yes     Caren Macadam 03/15/2019, 10:53 AM

## 2019-03-15 NOTE — Progress Notes (Signed)
PROGRESS NOTE    [facility]  XIP:[id number] DOB: April 29, 1924 DOA: 2019/04/10 PCP: Paul [facility], [facility]    Brief Narrative:   Paul Cox is a 86 year old male with history of advanced Alzheimer's dementia, PVD, frequent UTIs, vitamin D deficiency was sent from skilled nursing facility for low BP, fevers and UTI. Patient has severe dementia and unable to give any history. Per EMS, nursing home report, patient had some vomiting and was noticed to have strong smelling urine. He was noticed to have low-grade temp, O2 sats 90% on room air and was placed on 2 L.   Assessment & Plan:   Principal Problem:   Sepsis ([place]) Active Problems:   Chronic dementia ([place])   Acute lower UTI   AKI (acute kidney injury) ([place])   Lactic acidosis   Sepsis due to pneumonia [facility])   Palliative care encounter   Sepsis; present on admission Acute UTI Community-acquired pneumonia vs aspiration pneumonia Presenting from SNF with fever, hypotension, acute renal failure, lactic acidosis with leukocytosis.  Urinalysis with moderate leukoesterase, negative nitrite, and greater than 50 WBCs.  X-ray with patchy left greater than right perihilar density reflecting edema versus pneumonia.  Given his advanced dementia and severe sepsis, palliative care was consulted and followed during the hospital course.  Patient was initially started on empiric antibiotics with azithromycin, vancomycin, and cefepime.  Urine culture with multiple species.  CT abdomen/pelvis with cystitis appearance, chronic outlet obstruction with bladder diverticulosis and 14 mm pancreatic cyst which is stable in appearance.  Patient's clinical status continued to deteriorate throughout the hospital course despite aggressive measures.  Discussed with patient's son, Paul Cox at bedside on 03/13/2019; given patient's rapid decline without improvement, he wishes for his father to focus on a more palliative/comfort approach at this time.  --Discontinue all aggressive measures to include IV fluid, IV antibiotics --End-of-life care order set initiated with morphine infusion, Ativan, robinul --Comfort measures, anticipate in-hospital death --Social work for consult for possible transition to residential hospice  Advanced dementia --Discontinue home Aricept --Ativan as above as needed  AKI (acute kidney injury) (HCC)withLactic acidosis -Creatinine1.5, baseline creatinine 1.07in2019.  CT abdomen/pelvis notable for chronic bladder outlet obstruction. Cr 1.5-->1.37-->1.20 --Discontinued IV fluids, now transitioning to comfort measures  Prolonged QTC --Discontinue telemetry, now on comfort measures  History of DVT --Discontinue apixaban now on comfort measures.   DVT prophylaxis: Hospice/comfort measures Code Status: DNR Family Communication: None present at bedside this morning Disposition Plan: Continue hospice/comfort measures, anticipate in-hospital death; suspect hours to days of life remaining; possible transfer to residential hospice if bed available   Consultants:   Palliative care  Procedures:   Foley catheter - 1/6  Antimicrobials:   Vancomycin 1/4 - 1/6  Cefepime 1/4 - 1/6  Azithromycin 1/4 - 1/6   Subjective: Patient seen and examined at bedside, resting comfortably, remains obtunded, on morphine infusion with bolus available. No family present at bedside this morning.  No specific concerns per nursing staff.  Objective: Vitals:   03/13/19 1400 03/13/19 1559 03/14/19 0749 03/15/19 0750  BP:  (!) 77/46 117/86 109/68  Pulse: (!) 38 (!) 101 98 (!) 107  Resp: (!) 39 (!) 33 20 16  Temp:  98.4 F (36.9 C) 97.7 F (36.5 C) 99.6 F (37.6 C)  TempSrc:  Oral Oral Oral  SpO2: (!) 85% (!) 61% (!) 87% 96%  Weight:      Height:        Intake/Output Summary (Last 24 hours) at 03/15/2019 1350 Last data  filed at 03/15/2019 0701 Gross per 24 hour  Intake -  Output 625 ml  Net -625 ml    Filed Weights   03/27/2019 0954  Weight: 79.4 kg    Examination:  General exam:  unresponsive/obtunded Respiratory system: Coarse breath sounds bilaterally with crackles, increased respiratory effort with some accessory muscle use, on supplemental oxygen via nasal cannula Cardiovascular system: S1 & S2 heard, RRR. No JVD, murmurs, rubs, gallops or clicks. No pedal edema. Gastrointestinal system: Abdomen is nondistended, soft and nontender. No organomegaly or masses felt. Normal bowel sounds heard. Central nervous system: Obtunded Extremities: Moves extremities occasionally but not to command Skin: No rashes, lesions or ulcers Psychiatry: Unable to assess secondary to current mental status    Data Reviewed: I have personally reviewed following labs and imaging studies  CBC: Recent Labs  Lab 03/10/2019 0949 03/25/2019 2108 03/12/19 0336 03/13/19 0436  WBC 27.0* 22.0* 20.2* 22.8*  NEUTROABS 24.8*  --   --   --   HGB 11.9* 10.0* 10.4* 12.2*  HCT 37.4* 30.8* 32.6* 39.4  MCV 96.6 96.6 98.2 100.3*  PLT 201 227 151 200   Basic Metabolic Panel: Recent Labs  Lab 03/18/2019 0949 03/21/2019 2108 03/15/2019 2353 03/12/19 0821 03/13/19 0436  NA 141  --  139  --  144  K 4.2  --  5.5* 4.2 4.4  CL 108  --  112*  --  115*  CO2 21*  --  20*  --  15*  GLUCOSE 119*  --  115*  --  136*  BUN 27*  --  29*  --  39*  CREATININE 1.50*  --  1.37*  --  1.20  CALCIUM 8.9  --  7.7*  --  8.3*  MG  --  1.9  --   --   --    GFR: Estimated Creatinine Clearance: 38.9 mL/min (by C-G formula based on SCr of 1.2 mg/dL). Liver Function Tests: Recent Labs  Lab 03/19/2019 0949 03/13/19 0436  AST 32 74*  ALT 17 29  ALKPHOS 47 49  BILITOT 0.9 1.0  PROT 6.8 7.2  ALBUMIN 3.4* 3.6   No results for input(s): LIPASE, AMYLASE in the last 168 hours. No results for input(s): AMMONIA in the last 168 hours. Coagulation Profile: Recent Labs  Lab 03/22/2019 0949  INR 1.5*   Cardiac Enzymes: No results for  input(s): CKTOTAL, CKMB, CKMBINDEX, TROPONINI in the last 168 hours. BNP (last 3 results) No results for input(s): PROBNP in the last 8760 hours. HbA1C: No results for input(s): HGBA1C in the last 72 hours. CBG: Recent Labs  Lab 03/13/19 0933  GLUCAP 136*   Lipid Profile: No results for input(s): CHOL, HDL, LDLCALC, TRIG, CHOLHDL, LDLDIRECT in the last 72 hours. Thyroid Function Tests: No results for input(s): TSH, T4TOTAL, FREET4, T3FREE, THYROIDAB in the last 72 hours. Anemia Panel: No results for input(s): VITAMINB12, FOLATE, FERRITIN, TIBC, IRON, RETICCTPCT in the last 72 hours. Sepsis Labs: Recent Labs  Lab 04/05/2019 0949 03/18/2019 1206 03/23/2019 2108 03/28/2019 2353 03/12/19 0821  PROCALCITON 37.79  --   --   --   --   LATICACIDVEN 3.9* 3.4* 2.6* 2.0* 1.8    Recent Results (from the past 240 hour(s))  Culture, blood (Routine x 2)     Status: None (Preliminary result)   Collection Time: 04/06/2019  9:49 AM   Specimen: BLOOD RIGHT FOREARM  Result Value Ref Range Status   Specimen Description   Final    BLOOD RIGHT  FOREARM Performed at [facility], [address], [place], [place] [address]    Special Requests   Final    BOTTLES DRAWN AEROBIC AND ANAEROBIC Blood Culture adequate volume Performed at [facility], [address] [place], [place] [address]    Culture   Final    NO GROWTH 4 DAYS Performed at [facility], [address] [place], [place] [address]    Report Status PENDING  Incomplete  Culture, blood (Routine x 2)     Status: None (Preliminary result)   Collection Time: 03-28-19  9:49 AM   Specimen: BLOOD  Result Value Ref Range Status   Specimen Description   Final    BLOOD RIGHT ANTECUBITAL Performed at [facility], [address] [place], [place] [address]    Special Requests   Final    BOTTLES DRAWN AEROBIC ONLY Blood Culture adequate volume Performed at [facility], [address] [place], [place] [address]    Culture   Final    NO GROWTH 4 DAYS Performed at [facility], [address] [place], [place] [address]    Report Status PENDING  Incomplete  Urine culture     Status: Abnormal   Collection Time: 2019/03/28 12:06 PM   Specimen: In/Out Cath Urine  Result Value Ref Range Status   Specimen Description   Final    IN/OUT CATH URINE Performed at [facility], [address], [place], [place] [address]    Special Requests   Final    NONE Performed at [facility], [address] [place], [place] [address]    Culture MULTIPLE SPECIES PRESENT, SUGGEST RECOLLECTION (A)  Final   Report Status 03/12/2019 FINAL  Final  Respiratory Panel by RT PCR (Flu A&B, Covid) - Nasopharyngeal Swab     Status: None   Collection Time: March 28, 2019 12:06 PM   Specimen: Nasopharyngeal Swab  Result Value Ref Range Status   SARS Coronavirus 2 by RT PCR NEGATIVE NEGATIVE Final    Comment: (NOTE) SARS-CoV-2 target nucleic acids are NOT DETECTED. The SARS-CoV-2 RNA is generally detectable in upper respiratoy specimens during the acute phase of infection. The lowest concentration of SARS-CoV-2 viral copies this assay can detect is 131 copies/mL. A negative result does not preclude SARS-Cov-2 infection and should not be used as the sole basis for treatment or other patient management decisions. A negative result may occur with  improper specimen collection/handling, submission of specimen other than nasopharyngeal swab, presence of viral mutation(s) within the areas targeted by this assay, and inadequate number of viral copies (<131 copies/mL). A negative result must be combined with clinical observations, patient history, and epidemiological information. The expected result is Negative. Fact Sheet for Patients:  https://www.moore.com/ Fact Sheet for Healthcare Providers:   https://www.young.biz/ This test is not yet ap proved or cleared by the [place] FDA and  has been authorized for detection and/or diagnosis of SARS-CoV-2 by FDA under an Emergency Use Authorization (EUA). This EUA will remain  in effect (meaning this test can be used) for the duration of the COVID-19 declaration under Section 564(b)(1) of the Act, 21 U.S.C. section 360bbb-3(b)(1), unless the authorization is terminated or revoked sooner.    Influenza A by PCR NEGATIVE NEGATIVE Final   Influenza B by PCR NEGATIVE NEGATIVE Final    Comment: (NOTE) The Xpert Xpress SARS-CoV-2/FLU/RSV assay is intended as an aid in  the diagnosis of influenza  from Nasopharyngeal swab specimens and  should not be used as a sole basis for treatment. Nasal washings and  aspirates are unacceptable for Xpert Xpress SARS-CoV-2/FLU/RSV  testing. Fact Sheet for Patients: https://www.moore.com/ Fact Sheet for Healthcare Providers: https://www.young.biz/ This test is not yet approved or cleared by the [place] FDA and  has been authorized for detection and/or diagnosis of SARS-CoV-2 by  FDA under an Emergency Use Authorization (EUA). This EUA will remain  in effect (meaning this test can be used) for the duration of the  Covid-19 declaration under Section 564(b)(1) of the Act, 21  U.S.C. section 360bbb-3(b)(1), unless the authorization is  terminated or revoked. Performed at [facility], [address] [place], [place] [address]   MRSA PCR Screening     Status: None   Collection Time: 03/12/19  3:51 PM   Specimen: Nasal Mucosa; Nasopharyngeal  Result Value Ref Range Status   MRSA by PCR NEGATIVE NEGATIVE Final    Comment:        The GeneXpert MRSA Assay (FDA approved for NASAL specimens only), is one component of a comprehensive MRSA colonization surveillance program. It is not intended to diagnose MRSA infection  nor to guide or monitor treatment for MRSA infections. Performed at [facility], [address] [place], [place] [address]          Radiology Studies: No results found.      Scheduled Meds: . acetaminophen  975 mg Rectal Once  . Chlorhexidine Gluconate Cloth  6 each Topical Daily   Continuous Infusions: . sodium chloride 10 mL/hr at 03/13/19 1400  . morphine 2 mg/hr (03/14/19 1129)     LOS: 4 days    Time spent: 33 minutes spent on chart review, discussion with nursing staff, consultants, updating family and interview/physical exam; more than 50% of that time was spent in counseling and/or coordination of care.    Alvira Philips [name], DO [facility] 03/15/2019, 1:50 PM

## 2019-03-15 NOTE — Progress Notes (Signed)
Daily Progress Note   Patient Name: Paul [place]       Date: 03/15/2019 DOB: 01-Jun-1924  Age: 86 y.o. MRN#: [id number] Attending Physician: [name], [name] J, DO Primary Care Physician: Almetta Lovely, [facility] Admit Date: 04/05/19  Reason for Consultation/Follow-up: Terminal Care  Subjective:  patient appears comfortable, is unresponsive    Length of Stay: 4  Current Medications: Scheduled Meds:  . acetaminophen  975 mg Rectal Once  . Chlorhexidine Gluconate Cloth  6 each Topical Daily    Continuous Infusions: . sodium chloride 10 mL/hr at 03/13/19 1400  . morphine 2 mg/hr (03/14/19 1129)    PRN Meds: sodium chloride, acetaminophen **OR** acetaminophen, acetaminophen **OR** acetaminophen, antiseptic oral rinse, glycopyrrolate **OR** glycopyrrolate **OR** glycopyrrolate, LORazepam **OR** LORazepam **OR** LORazepam, morphine **AND** morphine, ondansetron **OR** ondansetron (ZOFRAN) IV, ondansetron **OR** ondansetron (ZOFRAN) IV, polyvinyl alcohol  Physical Exam         Weak gentleman Has some bruises on UE Coarse rhonchorous breath sounds S1 S2 Abdomen is not distended Trace edema No coolness no mottling noted Not awake not alert Moans at times Some shallow breathing, no apneic spells noted.  Vital Signs: BP 109/68 (BP Location: Right Arm)   Pulse (!) 107   Temp 99.6 F (37.6 C) (Oral)   Resp 16   Ht 5\' 6"  (1.676 m)   Wt 79.4 kg   SpO2 96%   BMI 28.25 kg/m  SpO2: SpO2: 96 % O2 Device: O2 Device: Nasal Cannula O2 Flow Rate: O2 Flow Rate (L/min): 7 L/min  Intake/output summary:   Intake/Output Summary (Last 24 hours) at 03/15/2019 1101 Last data filed at 03/15/2019 0701 Gross per 24 hour  Intake --  Output 625 ml  Net -625 ml   LBM: Last BM Date:  03/12/19 Baseline Weight: Weight: 79.4 kg Most recent weight: Weight: 79.4 kg       Palliative Assessment/Data:      Patient Active Problem List   Diagnosis Date Noted  . Palliative care encounter 03/13/2019  . Sepsis due to pneumonia (HCC) 03/12/2019  . Sepsis (HCC) 04/05/19  . Acute lower UTI 04/05/2019  . AKI (acute kidney injury) (HCC) 04/05/2019  . Lactic acidosis 04-05-2019  . Hypotension   . Acute diarrhea 03/04/2018  . Intertrigo 03/02/2018  . Chronic dementia (HCC) 03/02/2018  . Diverticulitis 03/01/2018  Palliative Care Assessment & Plan   Patient Profile:    Assessment:  actively dying Comfort care Generalized weakness Dyspnea Advanced demetia Recent UTI Possible recurrent aspiration PNA Shortness of breath  Recommendations/Plan:  Remains on Morphine PCA to Morphine infusion with provision for bolus dosages for ongoing comfort measures  Continue current mode of care, continue efforts to avoid and minimize suffering and provide dignity at end of life  Prognosis hours to some very limited number of days in my opinion.   Goals of Care and Additional Recommendations:  Limitations on Scope of Treatment: Full Comfort Care  Code Status:    Code Status Orders  (From admission, onward)         Start     Ordered   03/13/19 1234  Do not attempt resuscitation (DNR)  Continuous    Question Answer Comment  In the event of cardiac or respiratory ARREST Do not call a "code blue"   In the event of cardiac or respiratory ARREST Do not perform Intubation, CPR, defibrillation or ACLS   In the event of cardiac or respiratory ARREST Use medication by any route, position, wound care, and other measures to relive pain and suffering. May use oxygen, suction and manual treatment of airway obstruction as needed for comfort.   Comments Discussed with Khaden Gater at bedside on 03/13/2019      03/13/19 1235        Code Status History    Date Active Date  Inactive Code Status Order ID Comments User Context   Mar 16, 2019 1619 03/13/2019 1235 DNR [id number]  Mendel Corning, MD ED   03/01/2018 2314 03/05/2018 2118 DNR [id number]  Neva Seat, MD ED   Advance Care Planning Activity    Advance Directive Documentation     Most Recent Value  Type of Advance Directive  Out of facility DNR (pink MOST or yellow form)  Pre-existing out of facility DNR order (yellow form or pink MOST form)  Yellow form placed in chart (order not valid for inpatient use)  "MOST" Form in Place?  --       Prognosis:   Hours - Days  Discharge Planning:  Anticipated Hospital Death  Care plan was discussed with  RN and TRH MD.   Thank you for allowing the Palliative Medicine Team to assist in the care of this patient.   Time In: 10 Time Out: 10.15 Total Time 15 Prolonged Time Billed  no       Greater than 50%  of this time was spent counseling and coordinating care related to the above assessment and plan.  Loistine Chance, MD  Please contact Palliative Medicine Team phone at [phone] for questions and concerns.

## 2019-03-16 DIAGNOSIS — Z515 Encounter for palliative care: Secondary | ICD-10-CM

## 2019-03-16 DIAGNOSIS — R0602 Shortness of breath: Secondary | ICD-10-CM | POA: Diagnosis present

## 2019-03-16 LAB — CULTURE, BLOOD (ROUTINE X 2)
Culture: NO GROWTH
Culture: NO GROWTH
Special Requests: ADEQUATE
Special Requests: ADEQUATE

## 2019-04-08 NOTE — Progress Notes (Cosign Needed)
New bag of IV Morphine hung. 1 cc of Morphine from other bag wasted. Brynda Greathouse, Charge RN witnessed this waste.

## 2019-04-08 NOTE — Progress Notes (Signed)
I met and accompanied the Idelle Crouch to pt's room to administer last rights. While Idelle Crouch was w/pt staff confirmed pt had just passed. Idelle Crouch was informed and was okay with his ministry although pt had passed. Staff was in the process of notifying pt's son. Upon notification I will give a follow-up call to pt's son.  [place], [place]   Mar 21, 2019 1700  Clinical Encounter Type  Visited With Family

## 2019-04-08 NOTE — Progress Notes (Signed)
Pt's morphine drip d/c'd and 85ml was wasted in stericycle by this nurse. Jonetta Speak, RN witnessed waste of morphine.

## 2019-04-08 NOTE — Progress Notes (Signed)
Daily Progress Note   Patient Name: Paul [place]       Date: 03/22/2019 DOB: 1924-10-08  Age: 86 y.o. MRN#: [id number] Attending Physician: [place] [name] J, DO Primary Care Physician: Orvis Brill, [facility] Admit Date: 2019/03/14  Reason for Consultation/Follow-up: Terminal Care  Subjective:  patient appears comfortable, is unresponsive  No change essentially, since yesterday. Shallow breathing. Not on O2. Dry oral mucosa, mouth open, tongue somewhat edentulous noted.    Length of Stay: 5  Current Medications: Scheduled Meds:  . acetaminophen  975 mg Rectal Once    Continuous Infusions: . sodium chloride 500 mL (04/06/2019 1310)  . morphine 2 mg/hr (04/06/2019 0247)    PRN Meds: sodium chloride, acetaminophen **OR** acetaminophen, acetaminophen **OR** acetaminophen, antiseptic oral rinse, glycopyrrolate **OR** glycopyrrolate **OR** glycopyrrolate, LORazepam **OR** LORazepam **OR** LORazepam, morphine **AND** morphine, ondansetron **OR** ondansetron (ZOFRAN) IV, ondansetron **OR** ondansetron (ZOFRAN) IV, polyvinyl alcohol  Physical Exam         Weak gentleman Has some bruises on UE Coarse rhonchorous breath sounds S1 S2 Abdomen is not distended Trace edema No coolness no mottling noted Unresponsive  Appears pale  Vital Signs: BP 109/68 (BP Location: Right Arm)   Pulse (!) 107   Temp 99.6 F (37.6 C) (Oral)   Resp 16   Ht 5\' 6"  (1.676 m)   Wt 79.4 kg   SpO2 96%   BMI 28.25 kg/m  SpO2: SpO2: 96 % O2 Device: O2 Device: Nasal Cannula O2 Flow Rate: O2 Flow Rate (L/min): 7 L/min  Intake/output summary:   Intake/Output Summary (Last 24 hours) at 03/09/2019 1406 Last data filed at 03/23/2019 1000 Gross per 24 hour  Intake 132.97 ml  Output -  Net 132.97 ml    LBM: Last BM Date: 03/12/19 Baseline Weight: Weight: 79.4 kg Most recent weight: Weight: 79.4 kg       Palliative Assessment/Data:      Patient Active Problem List   Diagnosis Date Noted  . Palliative care encounter 03/13/2019  . Sepsis due to pneumonia ([place]) 03/12/2019  . Sepsis ([place]) 14-Mar-2019  . Acute lower UTI 03-14-2019  . AKI (acute kidney injury) ([place]) 2019-03-14  . Lactic acidosis 03-14-19  . Hypotension   . Acute diarrhea 03/04/2018  . Intertrigo 03/02/2018  . Chronic dementia ([place]) 03/02/2018  . Diverticulitis 03/01/2018  Palliative Care Assessment & Plan   Patient Profile:    Assessment:  actively dying Comfort care Generalized weakness Dyspnea Advanced demetia Recent UTI Possible recurrent aspiration PNA Shortness of breath  Recommendations/Plan:  Remains on Morphine PCA to Morphine infusion with provision for bolus dosages for ongoing comfort measures. Medications noted.   Continue current mode of care, continue efforts to avoid and minimize suffering and provide dignity at end of life  Prognosis hours to some very limited number of days in my opinion.   Goals of Care and Additional Recommendations:  Limitations on Scope of Treatment: Full Comfort Care  Code Status:    Code Status Orders  (From admission, onward)         Start     Ordered   03/13/19 1234  Do not attempt resuscitation (DNR)  Continuous    Question Answer Comment  In the event of cardiac or respiratory ARREST Do not call a "code blue"   In the event of cardiac or respiratory ARREST Do not perform Intubation, CPR, defibrillation or ACLS   In the event of cardiac or respiratory ARREST Use medication by any route, position, wound care, and other measures to relive pain and suffering. May use oxygen, suction and manual treatment of airway obstruction as needed for comfort.   Comments Discussed with Sinan Tuch at bedside on 03/13/2019      03/13/19 1235        Code  Status History    Date Active Date Inactive Code Status Order ID Comments User Context   04/05/2019 1619 03/13/2019 1235 DNR [id number]  Cathren Harsh, MD ED   03/01/2018 2314 03/05/2018 2118 DNR [id number]  Beola Cord, MD ED   Advance Care Planning Activity    Advance Directive Documentation     Most Recent Value  Type of Advance Directive  Out of facility DNR (pink MOST or yellow form)  Pre-existing out of facility DNR order (yellow form or pink MOST form)  Yellow form placed in chart (order not valid for inpatient use)  "MOST" Form in Place?  -       Prognosis:   Hours - Days  Discharge Planning:  Ok to consider transfer to residential hospice if bed available.   Care plan was discussed with    No family at bedside.  Thank you for allowing the Palliative Medicine Team to assist in the care of this patient.   Time In: 10 Time Out: 10.15 Total Time 15 Prolonged Time Billed  no       Greater than 50%  of this time was spent counseling and coordinating care related to the above assessment and plan.  Rosalin Hawking, MD  Please contact Palliative Medicine Team phone at [phone] for questions and concerns.

## 2019-04-08 NOTE — Progress Notes (Signed)
Pt's son Gene was bedside when I arrived. He had requested a Zorita Pang to administer last rights to pt whom he said is not expected to recover from this hospitalization. At the time of our visit I had contacted a parish and told him I would try another number. Pt's son gave me his number to call with an update. Will remain in touch w/son via son or bedside of pt. Please call if additional support is needed. Chaplain Elmarie Shiley, MDiv   04/04/2019 1400  Clinical Encounter Type  Visited With Family

## 2019-04-08 NOTE — Progress Notes (Signed)
Pt time of death at 1655 was verified by two RN's.  Dr. [name] notified and was on unit at time of death.  Pt's son, Davidlee Jeanbaptiste notified of pt's death.  Organ donor services notified.  Post mortem care was completed.

## 2019-04-08 NOTE — Progress Notes (Signed)
PROGRESS NOTE    Paul Cox [facility]  GDJ:[id number] DOB: 1924-10-20 DOA: 03/15/2019 PCP: Orvis Brill, [facility]    Brief Narrative:   Paul Cox is a 86 year old male with history of advanced Alzheimer's dementia, PVD, frequent UTIs, vitamin D deficiency was sent from skilled nursing facility for low BP, fevers and UTI. Patient has severe dementia and unable to give any history. Per EMS, nursing home report, patient had some vomiting and was noticed to have strong smelling urine. He was noticed to have low-grade temp, O2 sats 90% on room air and was placed on 2 L.   Assessment & Plan:   Principal Problem:   Sepsis ([place]) Active Problems:   Chronic dementia ([place])   Acute lower UTI   AKI (acute kidney injury) ([place])   Lactic acidosis   Sepsis due to pneumonia [facility])   Palliative care encounter   Sepsis; present on admission Acute UTI Community-acquired pneumonia vs aspiration pneumonia Presenting from SNF with fever, hypotension, acute renal failure, lactic acidosis with leukocytosis.  Urinalysis with moderate leukoesterase, negative nitrite, and greater than 50 WBCs.  X-ray with patchy left greater than right perihilar density reflecting edema versus pneumonia.  Given his advanced dementia and severe sepsis, palliative care was consulted and followed during the hospital course.  Patient was initially started on empiric antibiotics with azithromycin, vancomycin, and cefepime.  Urine culture with multiple species.  CT abdomen/pelvis with cystitis appearance, chronic outlet obstruction with bladder diverticulosis and 14 mm pancreatic cyst which is stable in appearance.  Patient's clinical status continued to deteriorate throughout the hospital course despite aggressive measures.  Discussed with patient's son, Paul Cox at bedside on 03/13/2019; given patient's rapid decline without improvement, he wishes for his father to focus on a more palliative/comfort approach at this time.  --Discontinue all aggressive measures to include IV fluid, IV antibiotics --End-of-life care order set initiated with morphine infusion, Ativan, robinul --Comfort measures, anticipate in-hospital death --Social work for consult for possible transition to residential hospice  Advanced dementia --Discontinue home Aricept --Ativan as above as needed  AKI (acute kidney injury) (HCC)withLactic acidosis -Creatinine1.5, baseline creatinine 1.07in2019.  CT abdomen/pelvis notable for chronic bladder outlet obstruction. Cr 1.5-->1.37-->1.20 --Discontinued IV fluids, now transitioned to comfort measures  Prolonged QTC --Discontinue telemetry, now on comfort measures  History of DVT --Discontinue apixaban now on comfort measures.   DVT prophylaxis: Hospice/comfort measures Code Status: DNR Family Communication: None present at bedside this morning Disposition Plan: Continue hospice/comfort measures, anticipate in-hospital death; suspect hours to days of life remaining; possible transfer to residential hospice if bed available   Consultants:   Palliative care  Procedures:   Foley catheter - 1/6  Antimicrobials:   Vancomycin 1/4 - 1/6  Cefepime 1/4 - 1/6  Azithromycin 1/4 - 1/6   Subjective: Patient seen and examined at bedside, resting comfortably, remains obtunded, on morphine infusion with bolus available. No family present at bedside this morning.  No specific concerns per nursing staff.  Objective: Vitals:   03/13/19 1400 03/13/19 1559 03/14/19 0749 03/15/19 0750  BP:  (!) 77/46 117/86 109/68  Pulse: (!) 38 (!) 101 98 (!) 107  Resp: (!) 39 (!) 33 20 16  Temp:  98.4 F (36.9 C) 97.7 F (36.5 C) 99.6 F (37.6 C)  TempSrc:  Oral Oral Oral  SpO2: (!) 85% (!) 61% (!) 87% 96%  Weight:      Height:        Intake/Output Summary (Last 24 hours) at 04-12-2019 1234 Last data  filed at 2019/03/27 1000 Gross per 24 hour  Intake 132.97 ml  Output -  Net 132.97 ml    Filed Weights   03/18/2019 0954  Weight: 79.4 kg    Examination:  General exam:  unresponsive/obtunded Respiratory system: Coarse breath sounds bilaterally with crackles, increased respiratory effort with some accessory muscle use, on supplemental oxygen via nasal cannula Cardiovascular system: S1 & S2 heard, RRR. No JVD, murmurs, rubs, gallops or clicks. No pedal edema. Gastrointestinal system: Abdomen is nondistended, soft and nontender. No organomegaly or masses felt. Normal bowel sounds heard. Central nervous system: Obtunded Extremities: Moves extremities occasionally but not to command Skin: No rashes, lesions or ulcers Psychiatry: Unable to assess secondary to current mental status    Data Reviewed: I have personally reviewed following labs and imaging studies  CBC: Recent Labs  Lab 03/08/2019 0949 03/23/2019 2108 03/12/19 0336 03/13/19 0436  WBC 27.0* 22.0* 20.2* 22.8*  NEUTROABS 24.8*  --   --   --   HGB 11.9* 10.0* 10.4* 12.2*  HCT 37.4* 30.8* 32.6* 39.4  MCV 96.6 96.6 98.2 100.3*  PLT 201 227 151 200   Basic Metabolic Panel: Recent Labs  Lab 03/18/2019 0949 03/23/2019 2108 03/28/2019 2353 03/12/19 0821 03/13/19 0436  NA 141  --  139  --  144  K 4.2  --  5.5* 4.2 4.4  CL 108  --  112*  --  115*  CO2 21*  --  20*  --  15*  GLUCOSE 119*  --  115*  --  136*  BUN 27*  --  29*  --  39*  CREATININE 1.50*  --  1.37*  --  1.20  CALCIUM 8.9  --  7.7*  --  8.3*  MG  --  1.9  --   --   --    GFR: Estimated Creatinine Clearance: 38.9 mL/min (by C-G formula based on SCr of 1.2 mg/dL). Liver Function Tests: Recent Labs  Lab 04/01/2019 0949 03/13/19 0436  AST 32 74*  ALT 17 29  ALKPHOS 47 49  BILITOT 0.9 1.0  PROT 6.8 7.2  ALBUMIN 3.4* 3.6   No results for input(s): LIPASE, AMYLASE in the last 168 hours. No results for input(s): AMMONIA in the last 168 hours. Coagulation Profile: Recent Labs  Lab 03/25/2019 0949  INR 1.5*   Cardiac Enzymes: No results for  input(s): CKTOTAL, CKMB, CKMBINDEX, TROPONINI in the last 168 hours. BNP (last 3 results) No results for input(s): PROBNP in the last 8760 hours. HbA1C: No results for input(s): HGBA1C in the last 72 hours. CBG: Recent Labs  Lab 03/13/19 0933  GLUCAP 136*   Lipid Profile: No results for input(s): CHOL, HDL, LDLCALC, TRIG, CHOLHDL, LDLDIRECT in the last 72 hours. Thyroid Function Tests: No results for input(s): TSH, T4TOTAL, FREET4, T3FREE, THYROIDAB in the last 72 hours. Anemia Panel: No results for input(s): VITAMINB12, FOLATE, FERRITIN, TIBC, IRON, RETICCTPCT in the last 72 hours. Sepsis Labs: Recent Labs  Lab 03/15/2019 0949 03/28/2019 1206 03/10/2019 2108 04/06/2019 2353 03/12/19 0821  PROCALCITON 37.79  --   --   --   --   LATICACIDVEN 3.9* 3.4* 2.6* 2.0* 1.8    Recent Results (from the past 240 hour(s))  Culture, blood (Routine x 2)     Status: None   Collection Time: 03/15/2019  9:49 AM   Specimen: BLOOD RIGHT FOREARM  Result Value Ref Range Status   Specimen Description   Final    BLOOD RIGHT FOREARM Performed  at [facility], [address], [place], [place] [address]    Special Requests   Final    BOTTLES DRAWN AEROBIC AND ANAEROBIC Blood Culture adequate volume Performed at [facility], [address] [place], [place] [address]    Culture   Final    NO GROWTH 5 DAYS Performed at [facility], [address] [place], [place] [address]    Report Status 2019/04/15 FINAL  Final  Culture, blood (Routine x 2)     Status: None   Collection Time: 04/04/2019  9:49 AM   Specimen: BLOOD  Result Value Ref Range Status   Specimen Description   Final    BLOOD RIGHT ANTECUBITAL Performed at [facility], [address] [place], [place] [address]    Special Requests   Final    BOTTLES DRAWN AEROBIC ONLY Blood Culture adequate volume Performed at [facility], [address] [place],  [place] [address]    Culture   Final    NO GROWTH 5 DAYS Performed at [facility], [address], [place], [place] [address]    Report Status 2019-04-15 FINAL  Final  Urine culture     Status: Abnormal   Collection Time: 03/19/2019 12:06 PM   Specimen: In/Out Cath Urine  Result Value Ref Range Status   Specimen Description   Final    IN/OUT CATH URINE Performed at [facility], [address] [place], [place] [address]    Special Requests   Final    NONE Performed at [facility], [address] [place], [place] [address]    Culture MULTIPLE SPECIES PRESENT, SUGGEST RECOLLECTION (A)  Final   Report Status 03/12/2019 FINAL  Final  Respiratory Panel by RT PCR (Flu A&B, Covid) - Nasopharyngeal Swab     Status: None   Collection Time: 03/22/2019 12:06 PM   Specimen: Nasopharyngeal Swab  Result Value Ref Range Status   SARS Coronavirus 2 by RT PCR NEGATIVE NEGATIVE Final    Comment: (NOTE) SARS-CoV-2 target nucleic acids are NOT DETECTED. The SARS-CoV-2 RNA is generally detectable in upper respiratoy specimens during the acute phase of infection. The lowest concentration of SARS-CoV-2 viral copies this assay can detect is 131 copies/mL. A negative result does not preclude SARS-Cov-2 infection and should not be used as the sole basis for treatment or other patient management decisions. A negative result may occur with  improper specimen collection/handling, submission of specimen other than nasopharyngeal swab, presence of viral mutation(s) within the areas targeted by this assay, and inadequate number of viral copies (<131 copies/mL). A negative result must be combined with clinical observations, patient history, and epidemiological information. The expected result is Negative. Fact Sheet for Patients:  https://www.moore.com/ Fact Sheet for Healthcare Providers:  https://www.young.biz/ This test is not yet  ap proved or cleared by the [place] FDA and  has been authorized for detection and/or diagnosis of SARS-CoV-2 by FDA under an Emergency Use Authorization (EUA). This EUA will remain  in effect (meaning this test can be used) for the duration of the COVID-19 declaration under Section 564(b)(1) of the Act, 21 U.S.C. section 360bbb-3(b)(1), unless the authorization is terminated or revoked sooner.    Influenza A by PCR NEGATIVE NEGATIVE Final   Influenza B by PCR NEGATIVE NEGATIVE Final    Comment: (NOTE) The Xpert Xpress SARS-CoV-2/FLU/RSV assay is intended as an aid in  the diagnosis of influenza from Nasopharyngeal  swab specimens and  should not be used as a sole basis for treatment. Nasal washings and  aspirates are unacceptable for Xpert Xpress SARS-CoV-2/FLU/RSV  testing. Fact Sheet for Patients: https://www.moore.com/ Fact Sheet for Healthcare Providers: https://www.young.biz/ This test is not yet approved or cleared by the [place] FDA and  has been authorized for detection and/or diagnosis of SARS-CoV-2 by  FDA under an Emergency Use Authorization (EUA). This EUA will remain  in effect (meaning this test can be used) for the duration of the  Covid-19 declaration under Section 564(b)(1) of the Act, 21  U.S.C. section 360bbb-3(b)(1), unless the authorization is  terminated or revoked. Performed at [facility], [address], [place], [place] [address]   MRSA PCR Screening     Status: None   Collection Time: 03/12/19  3:51 PM   Specimen: Nasal Mucosa; Nasopharyngeal  Result Value Ref Range Status   MRSA by PCR NEGATIVE NEGATIVE Final    Comment:        The GeneXpert MRSA Assay (FDA approved for NASAL specimens only), is one component of a comprehensive MRSA colonization surveillance program. It is not intended to diagnose MRSA infection nor to guide or monitor treatment for MRSA infections. Performed  at [facility], [address] [place], [place] [address]          Radiology Studies: No results found.      Scheduled Meds: . acetaminophen  975 mg Rectal Once  . Chlorhexidine Gluconate Cloth  6 each Topical Daily   Continuous Infusions: . sodium chloride 10 mL/hr at 03/13/19 1400  . morphine 2 mg/hr (2019/04/04 0247)     LOS: 5 days    Time spent: 26 minutes spent on chart review, discussion with nursing staff, consultants, updating family and interview/physical exam; more than 50% of that time was spent in counseling and/or coordination of care.    Alvira Philips [name], DO [facility] 04/04/2019, 12:34 PM

## 2019-04-08 NOTE — Death Summary Note (Signed)
DEATH SUMMARY   Patient Details  Name: Paul Cox MRN: [id number] DOB: March 07, 1925  Admission/Discharge Information   Admit Date:  04/03/19  Date of Death:    Time of Death:    Length of Stay: 5  Referring Physician: Housecalls, Doctors Making   Reason(s) for Hospitalization  Severe sepsis with lactic acidosis Acute metabolic encephalopathy Urinary tract infection Acute renal failure Community-acquired versus aspiration pneumonia  Diagnoses  Preliminary cause of death: Severe sepsis ([place]) Secondary Diagnoses (including complications and co-morbidities):  Principal Problem:   Sepsis ([place]) Active Problems:   Chronic dementia ([place])   Acute lower UTI   AKI (acute kidney injury) ([place])   Lactic acidosis   Sepsis due to pneumonia [facility])   Palliative care encounter   Dying care   Shortness of breath   Brief Hospital Course (including significant findings, care, treatment, and services provided and events leading to death)  Paul Cox is a 86 y.o. year old male history of advanced Alzheimer's dementia, PVD, frequent UTIs, vitamin D deficiency was sent from skilled nursing facility for low BP, fevers and UTI. Patient has severe dementia and unable to give any history. Per EMS, nursing home report, patient had some vomiting and was noticed to have strong smelling urine. He was noticed to have low-grade temp, O2 sats 90% on room air and was placed on 2 L.  Sepsis; present on admission Acute UTI Community-acquired pneumonia vs aspiration pneumonia Acute renal failure History of advanced dementia Presenting from SNF with fever, hypotension, acute renal failure, lactic acidosis and leukocytosis.  Urinalysis with moderate leukoesterase, negative nitrite, and greater than 50 WBCs.  X-ray with patchy left greater than right perihilar density reflecting edema versus pneumonia.  Given his advanced dementia and severe sepsis, palliative care was consulted and followed  during the hospital course.  Patient was initially started on empiric antibiotics with azithromycin, vancomycin, and cefepime.  Urine culture with multiple species.  CT abdomen/pelvis with cystitis appearance, chronic outlet obstruction with bladder diverticulosis and 14 mm pancreatic cyst which is stable in appearance.  Patient's clinical status continued to deteriorate throughout the hospital course despite aggressive measures. Discussed with patient's son, Malaquias at bedside on 03/13/2019; given patient's rapid decline without improvement, he wishes for his father to focus on a more palliative/comfort approach at this time.  All aggressive measures were discontinued at time to include IV fluids IV antibiotics.  Patient was started on IV morphine infusion with as needed Ativan and Robinul for comfort.  Patient was pronounced deceased on 04-08-2019 at approximately 1655.  Nursing staff notified patient's son of his passing.  Condolences offered.  Chaplain services offered.    Pertinent Labs and Studies  Significant Diagnostic Studies CT ABDOMEN PELVIS W CONTRAST  Result Date: April 03, 2019 CLINICAL DATA:  Hip Dom pain with diverticulitis suspected. Question UTI with emesis. Strong smelling urine EXAM: CT ABDOMEN AND PELVIS WITH CONTRAST TECHNIQUE: Multidetector CT imaging of the abdomen and pelvis was performed using the standard protocol following bolus administration of intravenous contrast. CONTRAST:  33mL OMNIPAQUE IOHEXOL 300 MG/ML  SOLN COMPARISON:  03/01/2018 FINDINGS: Lower chest: Remote left ventricular apex infarct with thinning. Coronary calcification. Stable cardiomegaly. Hepatobiliary: No convincing masslike finding.No evidence of biliary obstruction or stone. Pancreas: Generalized atrophy. Unilocular appearing cystic density in the pancreatic tail measuring 14 mm. No change from prior in retrospect. Given stability and history of dementia follow-up is likely not indicated. Spleen: Unremarkable.  Adrenals/Urinary Tract: Negative adrenals. No hydronephrosis or stone. Thick walled bladder  with diverticula/cellules. Bladder wall is mildly indistinct the level of the perivesicular fat. Stomach/Bowel: No obstruction. No appendicitis. Numerous left colonic diverticula. Vascular/Lymphatic: No acute vascular abnormality. Diffuse atherosclerotic calcification of the aorta. No mass or adenopathy. Reproductive:Enlarged prostate up lifting the bladder based and showing nodularity that is similar to prior. Other: No ascites or pneumoperitoneum.  Fatty left inguinal hernia. Musculoskeletal: Advanced spinal degeneration with mild dextroscoliosis. No acute osseous finding. Remote right rib fractures posteriorly. IMPRESSION: 1. Cystitis appearance. There are background findings of chronic outlet obstruction with bladder diverticula and cellules. The prostate is chronically enlarged and nodular. 2. Sigmoid diverticulosis. 3. Aortic Atherosclerosis (ICD10-I70.0). There is also coronary atherosclerosis and remote left ventricular apex infarct. 4. 14 mm pancreatic cystic lesion that remain stable from CT 1 year ago. Electronically Signed   By: Marnee Spring M.D.   On: 03/08/2019 11:37   DG CHEST PORT 1 VIEW  Result Date: 03/13/2019 CLINICAL DATA:  Dyspnea EXAM: PORTABLE CHEST 1 VIEW COMPARISON:  Fall 2 days ago FINDINGS: Cardiomegaly and vascular pedicle widening. Diffuse interstitial opacity is increased. Haziness at the right base which may reflect pleural fluid. Artifact from EKG leads. IMPRESSION: New interstitial opacities most consistent with edema. Electronically Signed   By: Marnee Spring M.D.   On: 03/13/2019 08:09   DG Chest Portable 1 View  Result Date: 03/23/2019 CLINICAL DATA:  Fever and weakness EXAM: PORTABLE CHEST 1 VIEW COMPARISON:  03/01/2018 FINDINGS: Low lung volumes reflecting poor inspiration. Patchy left greater than right perihilar density. No significant pleural effusion or pneumothorax.  Cardiomediastinal contours are similar with probable cardiomegaly. IMPRESSION: Patchy left greater than right perihilar density, which could reflect edema or pneumonia in the appropriate setting. Electronically Signed   By: Guadlupe Spanish M.D.   On: 03/22/2019 10:44    Microbiology Recent Results (from the past 240 hour(s))  Culture, blood (Routine x 2)     Status: None   Collection Time: 04/03/2019  9:49 AM   Specimen: BLOOD RIGHT FOREARM  Result Value Ref Range Status   Specimen Description   Final    BLOOD RIGHT FOREARM Performed at [facility], [address] [place], [place] [address]    Special Requests   Final    BOTTLES DRAWN AEROBIC AND ANAEROBIC Blood Culture adequate volume Performed at [facility], [address] [place], [place] [address]    Culture   Final    NO GROWTH 5 DAYS Performed at [facility], [address] [place], [place] [address]    Report Status 04-13-2019 FINAL  Final  Culture, blood (Routine x 2)     Status: None   Collection Time: 04/06/2019  9:49 AM   Specimen: BLOOD  Result Value Ref Range Status   Specimen Description   Final    BLOOD RIGHT ANTECUBITAL Performed at [facility], [address] [place], [place] [address]    Special Requests   Final    BOTTLES DRAWN AEROBIC ONLY Blood Culture adequate volume Performed at [facility], [address], [place], [place] [address]    Culture   Final    NO GROWTH 5 DAYS Performed at [facility], [address], [place], [place] [address]    Report Status 04-13-2019 FINAL  Final  Urine culture     Status: Abnormal   Collection Time: 03/17/2019 12:06 PM   Specimen: In/Out Cath Urine  Result Value Ref Range Status   Specimen Description  Final    IN/OUT CATH URINE Performed at [facility], [address] [place], [place] [address]    Special Requests   Final    NONE Performed at  [facility], [address], [place], [place] [address]    Culture MULTIPLE SPECIES PRESENT, SUGGEST RECOLLECTION (A)  Final   Report Status 03/12/2019 FINAL  Final  Respiratory Panel by RT PCR (Flu A&B, Covid) - Nasopharyngeal Swab     Status: None   Collection Time: 04/04/2019 12:06 PM   Specimen: Nasopharyngeal Swab  Result Value Ref Range Status   SARS Coronavirus 2 by RT PCR NEGATIVE NEGATIVE Final    Comment: (NOTE) SARS-CoV-2 target nucleic acids are NOT DETECTED. The SARS-CoV-2 RNA is generally detectable in upper respiratoy specimens during the acute phase of infection. The lowest concentration of SARS-CoV-2 viral copies this assay can detect is 131 copies/mL. A negative result does not preclude SARS-Cov-2 infection and should not be used as the sole basis for treatment or other patient management decisions. A negative result may occur with  improper specimen collection/handling, submission of specimen other than nasopharyngeal swab, presence of viral mutation(s) within the areas targeted by this assay, and inadequate number of viral copies (<131 copies/mL). A negative result must be combined with clinical observations, patient history, and epidemiological information. The expected result is Negative. Fact Sheet for Patients:  https://www.moore.com/ Fact Sheet for Healthcare Providers:  https://www.young.biz/ This test is not yet ap proved or cleared by the [place] FDA and  has been authorized for detection and/or diagnosis of SARS-CoV-2 by FDA under an Emergency Use Authorization (EUA). This EUA will remain  in effect (meaning this test can be used) for the duration of the COVID-19 declaration under Section 564(b)(1) of the Act, 21 U.S.C. section 360bbb-3(b)(1), unless the authorization is terminated or revoked sooner.    Influenza A by PCR NEGATIVE NEGATIVE Final   Influenza B by PCR NEGATIVE NEGATIVE Final     Comment: (NOTE) The Xpert Xpress SARS-CoV-2/FLU/RSV assay is intended as an aid in  the diagnosis of influenza from Nasopharyngeal swab specimens and  should not be used as a sole basis for treatment. Nasal washings and  aspirates are unacceptable for Xpert Xpress SARS-CoV-2/FLU/RSV  testing. Fact Sheet for Patients: https://www.moore.com/ Fact Sheet for Healthcare Providers: https://www.young.biz/ This test is not yet approved or cleared by the [place] FDA and  has been authorized for detection and/or diagnosis of SARS-CoV-2 by  FDA under an Emergency Use Authorization (EUA). This EUA will remain  in effect (meaning this test can be used) for the duration of the  Covid-19 declaration under Section 564(b)(1) of the Act, 21  U.S.C. section 360bbb-3(b)(1), unless the authorization is  terminated or revoked. Performed at [facility], [address], [place], [place] [address]   MRSA PCR Screening     Status: None   Collection Time: 03/12/19  3:51 PM   Specimen: Nasal Mucosa; Nasopharyngeal  Result Value Ref Range Status   MRSA by PCR NEGATIVE NEGATIVE Final    Comment:        The GeneXpert MRSA Assay (FDA approved for NASAL specimens only), is one component of a comprehensive MRSA colonization surveillance program. It is not intended to diagnose MRSA infection nor to guide or monitor treatment for MRSA infections. Performed at [facility], [address], [place], [place] [address]     Lab Basic Metabolic Panel: Recent Labs  Lab 04-04-2019 [phone] 04-Apr-2019  2108 April 01, 2019 2353 03/12/19 0821 03/13/19 0436  NA 141  --  139  --  144  K 4.2  --  5.5* 4.2 4.4  CL 108  --  112*  --  115*  CO2 21*  --  20*  --  15*  GLUCOSE 119*  --  115*  --  136*  BUN 27*  --  29*  --  39*  CREATININE 1.50*  --  1.37*  --  1.20  CALCIUM 8.9  --  7.7*  --  8.3*  MG  --  1.9  --   --   --    Liver Function  Tests: Recent Labs  Lab April 01, 2019 0949 03/13/19 0436  AST 32 74*  ALT 17 29  ALKPHOS 47 49  BILITOT 0.9 1.0  PROT 6.8 7.2  ALBUMIN 3.4* 3.6   No results for input(s): LIPASE, AMYLASE in the last 168 hours. No results for input(s): AMMONIA in the last 168 hours. CBC: Recent Labs  Lab Apr 01, 2019 0949 Apr 01, 2019 2108 03/12/19 0336 03/13/19 0436  WBC 27.0* 22.0* 20.2* 22.8*  NEUTROABS 24.8*  --   --   --   HGB 11.9* 10.0* 10.4* 12.2*  HCT 37.4* 30.8* 32.6* 39.4  MCV 96.6 96.6 98.2 100.3*  PLT 201 227 151 200   Cardiac Enzymes: No results for input(s): CKTOTAL, CKMB, CKMBINDEX, TROPONINI in the last 168 hours. Sepsis Labs: Recent Labs  Lab 2019/04/01 0949 2019-04-01 1206 04/01/2019 2108 04/01/2019 2353 03/12/19 0336 03/12/19 0821 03/13/19 0436  PROCALCITON 37.79  --   --   --   --   --   --   WBC 27.0*  --  22.0*  --  20.2*  --  22.8*  LATICACIDVEN 3.9* 3.4* 2.6* 2.0*  --  1.8  --     Procedures/Operations     Alvira Philips [name], DO 03/14/2019, 5:11 PM

## 2019-04-08 DEATH — deceased

## 2020-04-12 IMAGING — CT CT ABD-PELV W/ CM
2 of 5 series · 16 of 46 positions shown, 18 images · IV contrast (omnipaque)
Comparison: 03/01/2018

CLINICAL DATA: Hip Damocles pain with diverticulitis suspected. Question
UTI with emesis. Strong smelling urine

EXAM:
CT ABDOMEN AND PELVIS WITH CONTRAST
TECHNIQUE: Multidetector CT imaging of the abdomen and pelvis was performed
using the standard protocol following bolus administration of
intravenous contrast.
CONTRAST:  80mL OMNIPAQUE IOHEXOL 300 MG/ML  SOLN

[Series 2: axial st · axial · 0.85mm/px · z∈[-429,-39]mm · 13 of 92 slices shown, 15 images]
[im 7/92  soft-tissue]
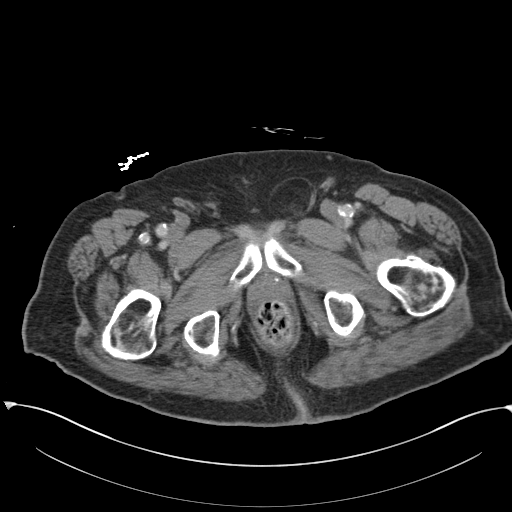
[im 7/92  bone]
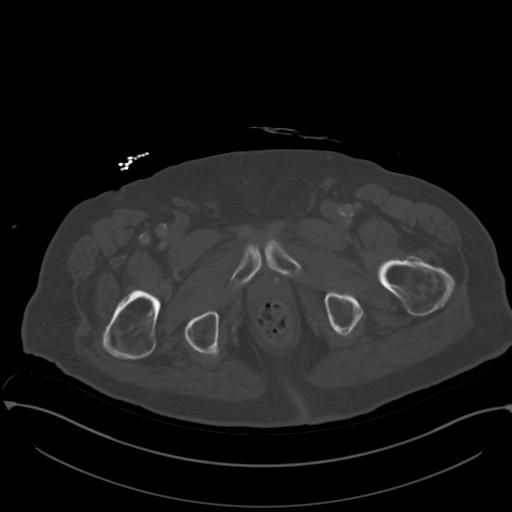
[im 14/92  soft-tissue]
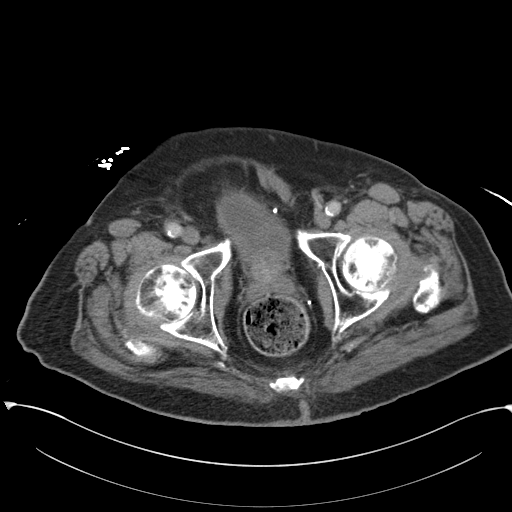
[im 20/92  soft-tissue]
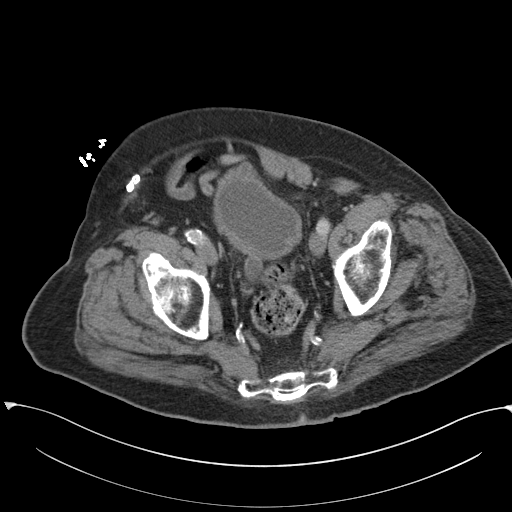
[im 27/92  soft-tissue]
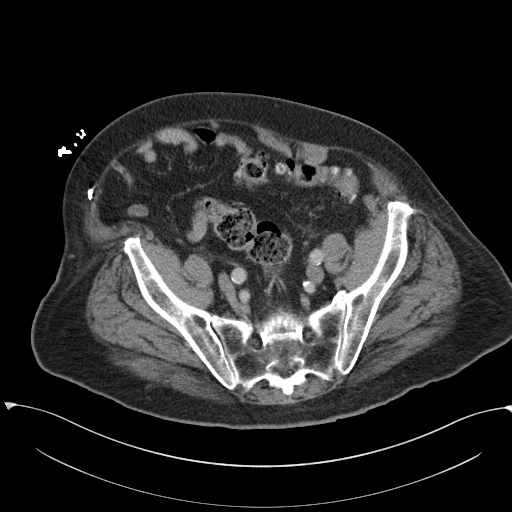
[im 33/92  soft-tissue]
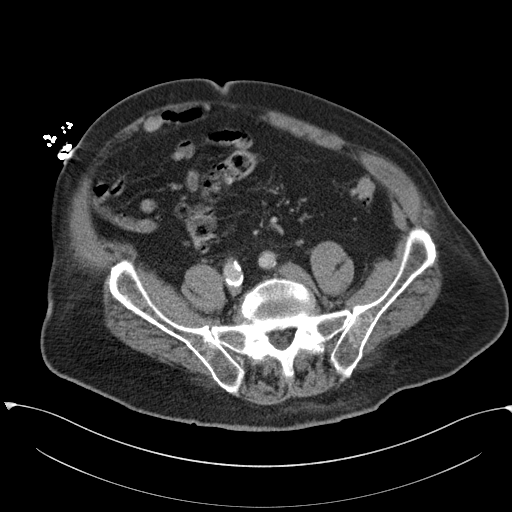
[im 40/92  soft-tissue]
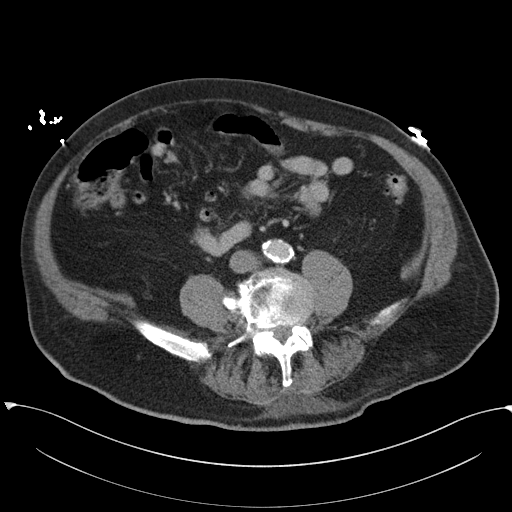
[im 46/92  soft-tissue]
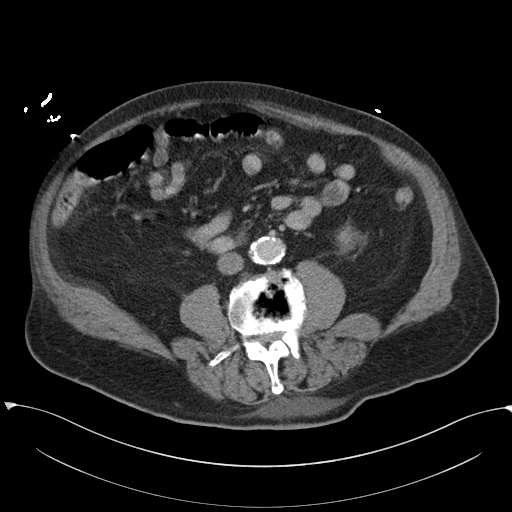
[im 53/92  soft-tissue]
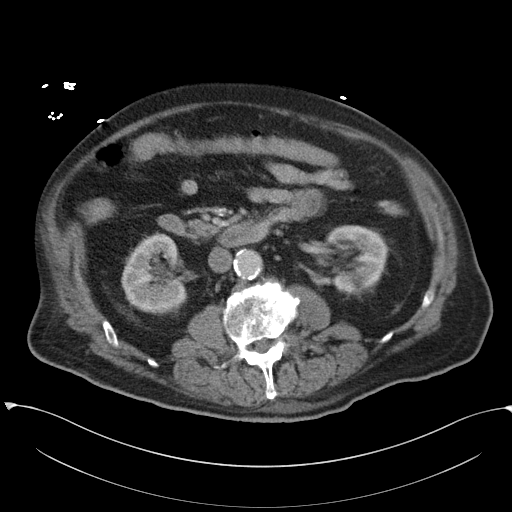
[im 59/92  soft-tissue]
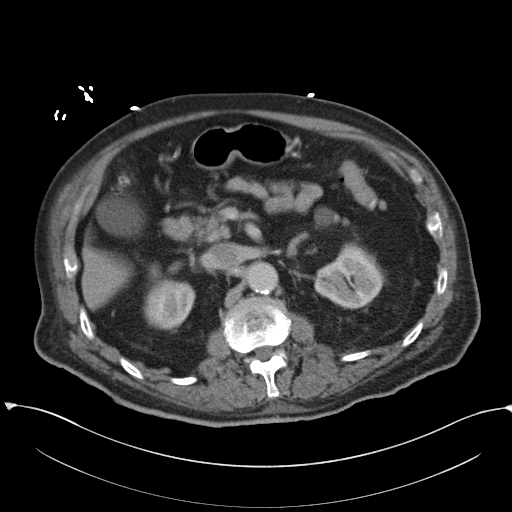
[im 59/92  bone]
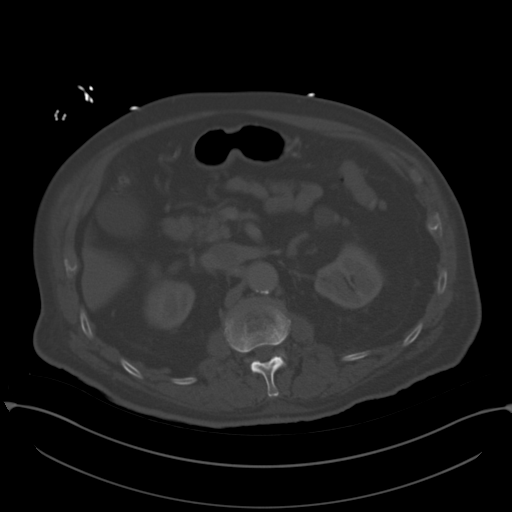
[im 66/92  soft-tissue]
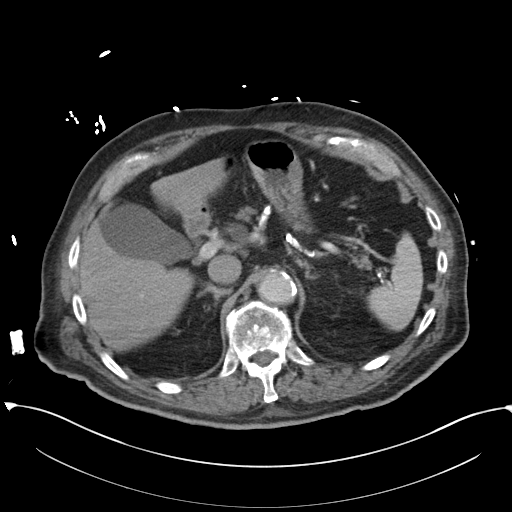
[im 72/92  soft-tissue]
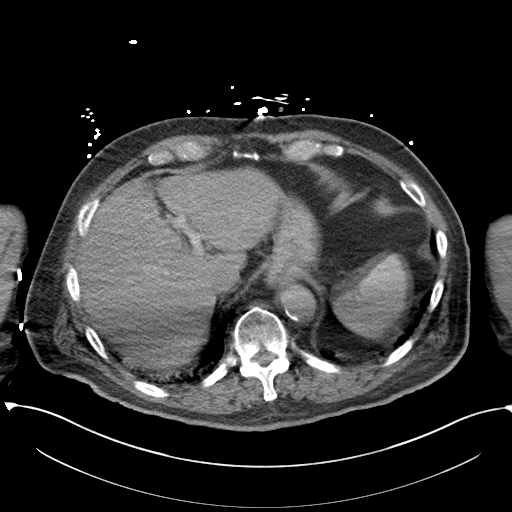
[im 79/92  soft-tissue]
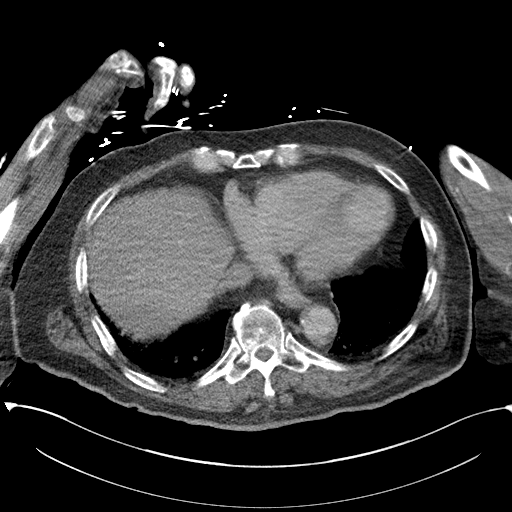
[im 85/92  soft-tissue]
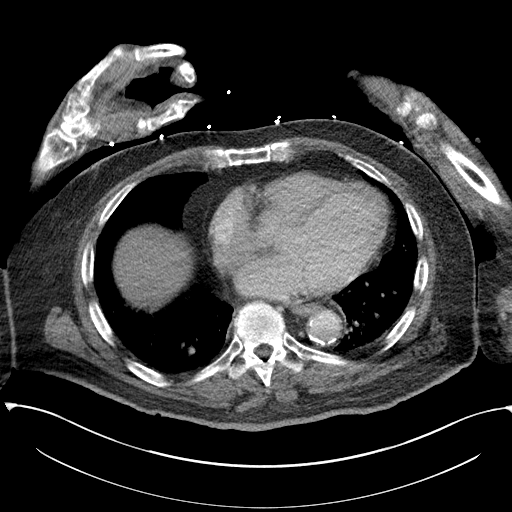

[Series 4: coronal st · coronal · 0.79mm/px · 3 of 162 slices shown]
[im 54/162  soft-tissue]
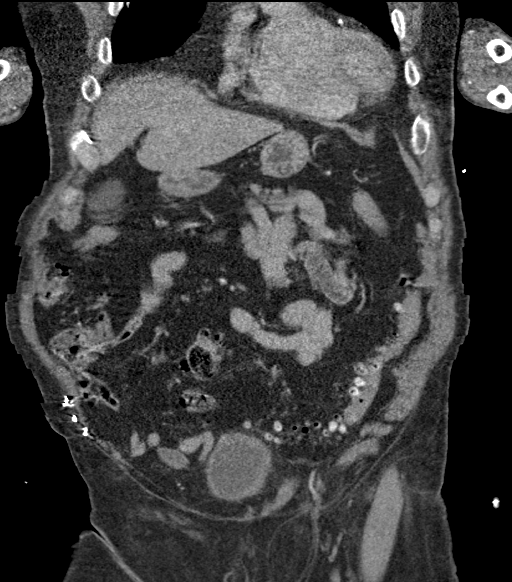
[im 72/162  soft-tissue]
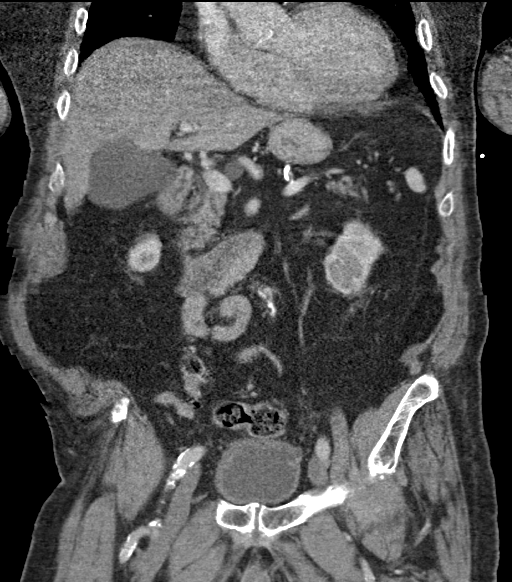
[im 90/162  soft-tissue]
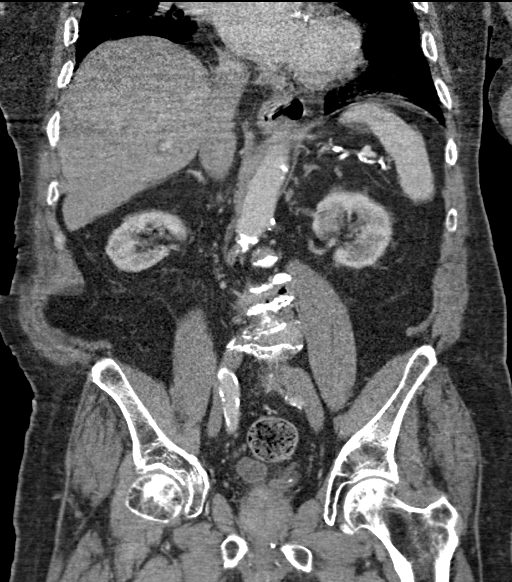

[16 of 46 positions shown; findings below may reference images not displayed]

FINDINGS: Lower chest: Remote left ventricular apex infarct with thinning.
Coronary calcification. Stable cardiomegaly.

Hepatobiliary: No convincing masslike finding.No evidence of biliary
obstruction or stone.

Pancreas: Generalized atrophy. Unilocular appearing cystic density
in the pancreatic tail measuring 14 mm. No change from prior in
retrospect. Given stability and history of dementia follow-up is
likely not indicated.

Spleen: Unremarkable.

Adrenals/Urinary Tract: Negative adrenals. No hydronephrosis or
stone. Thick walled bladder with diverticula/cellules. Bladder wall
is mildly indistinct the level of the perivesicular fat.

Stomach/Bowel: No obstruction. No appendicitis. Numerous left
colonic diverticula.

Vascular/Lymphatic: No acute vascular abnormality. Diffuse
atherosclerotic calcification of the aorta. No mass or adenopathy.

Reproductive:Enlarged prostate up lifting the bladder based and
showing nodularity that is similar to prior.

Other: No ascites or pneumoperitoneum.  Fatty left inguinal hernia.

Musculoskeletal: Advanced spinal degeneration with mild
dextroscoliosis. No acute osseous finding. Remote right rib
fractures posteriorly.
IMPRESSION: 1. Cystitis appearance. There are background findings of chronic
outlet obstruction with bladder diverticula and cellules. The
prostate is chronically enlarged and nodular.
2. Sigmoid diverticulosis.
3. Aortic Atherosclerosis ([id number]). There is also coronary
atherosclerosis and remote left ventricular apex infarct.
4. 14 mm pancreatic cystic lesion that remain stable from CT 1 year
ago.
